# Patient Record
Sex: Male | Born: 1960
Health system: Southern US, Community
[De-identification: ages and names within clinical notes are randomized; demographics above are authoritative.]

## PROBLEM LIST (undated history)

## (undated) DIAGNOSIS — D126 Benign neoplasm of colon, unspecified: Secondary | ICD-10-CM

## (undated) DIAGNOSIS — Z803 Family history of malignant neoplasm of breast: Secondary | ICD-10-CM

## (undated) DIAGNOSIS — Z86010 Personal history of colonic polyps: Secondary | ICD-10-CM

## (undated) DIAGNOSIS — Z1589 Genetic susceptibility to other disease: Secondary | ICD-10-CM

## (undated) DIAGNOSIS — T7840XA Allergy, unspecified, initial encounter: Secondary | ICD-10-CM

## (undated) DIAGNOSIS — Z8481 Family history of carrier of genetic disease: Secondary | ICD-10-CM

## (undated) HISTORY — DX: Personal history of colonic polyps: Z86.010

## (undated) HISTORY — DX: Family history of malignant neoplasm of breast: Z80.3

## (undated) HISTORY — PX: COLONOSCOPY W/ BIOPSIES: SHX1374

## (undated) HISTORY — PX: COLONOSCOPY: SHX174

## (undated) HISTORY — DX: Benign neoplasm of colon, unspecified: D12.6

## (undated) HISTORY — DX: Family history of carrier of genetic disease: Z84.81

## (undated) HISTORY — DX: Allergy, unspecified, initial encounter: T78.40XA

## (undated) HISTORY — DX: Genetic susceptibility to other disease: Z15.89

## (undated) HISTORY — PX: TONSILLECTOMY: SUR1361

## (undated) HISTORY — PX: MOUTH SURGERY: SHX715

---

## 1987-10-26 HISTORY — PX: HERNIA REPAIR: SHX51

## 2011-02-22 DIAGNOSIS — J302 Other seasonal allergic rhinitis: Secondary | ICD-10-CM | POA: Insufficient documentation

## 2014-01-29 DIAGNOSIS — D126 Benign neoplasm of colon, unspecified: Secondary | ICD-10-CM

## 2014-01-29 HISTORY — DX: Benign neoplasm of colon, unspecified: D12.6

## 2014-01-30 DIAGNOSIS — Z860100 Personal history of colon polyps, unspecified: Secondary | ICD-10-CM

## 2014-01-30 HISTORY — DX: Personal history of colon polyps, unspecified: Z86.0100

## 2014-01-30 HISTORY — DX: Personal history of colonic polyps: Z86.010

## 2015-11-03 DIAGNOSIS — F4323 Adjustment disorder with mixed anxiety and depressed mood: Secondary | ICD-10-CM | POA: Diagnosis not present

## 2015-11-10 DIAGNOSIS — J0100 Acute maxillary sinusitis, unspecified: Secondary | ICD-10-CM | POA: Diagnosis not present

## 2015-11-20 DIAGNOSIS — F4323 Adjustment disorder with mixed anxiety and depressed mood: Secondary | ICD-10-CM | POA: Diagnosis not present

## 2015-11-21 DIAGNOSIS — F4323 Adjustment disorder with mixed anxiety and depressed mood: Secondary | ICD-10-CM | POA: Diagnosis not present

## 2015-12-01 DIAGNOSIS — F4323 Adjustment disorder with mixed anxiety and depressed mood: Secondary | ICD-10-CM | POA: Diagnosis not present

## 2015-12-31 DIAGNOSIS — F4323 Adjustment disorder with mixed anxiety and depressed mood: Secondary | ICD-10-CM | POA: Diagnosis not present

## 2016-01-08 DIAGNOSIS — J302 Other seasonal allergic rhinitis: Secondary | ICD-10-CM | POA: Diagnosis not present

## 2016-01-08 DIAGNOSIS — Z0000 Encounter for general adult medical examination without abnormal findings: Secondary | ICD-10-CM | POA: Diagnosis not present

## 2016-01-08 DIAGNOSIS — R351 Nocturia: Secondary | ICD-10-CM | POA: Diagnosis not present

## 2016-01-19 DIAGNOSIS — F4323 Adjustment disorder with mixed anxiety and depressed mood: Secondary | ICD-10-CM | POA: Diagnosis not present

## 2016-01-29 DIAGNOSIS — F4323 Adjustment disorder with mixed anxiety and depressed mood: Secondary | ICD-10-CM | POA: Diagnosis not present

## 2016-02-02 ENCOUNTER — Ambulatory Visit (HOSPITAL_BASED_OUTPATIENT_CLINIC_OR_DEPARTMENT_OTHER): Payer: 59 | Admitting: Genetic Counselor

## 2016-02-02 ENCOUNTER — Other Ambulatory Visit: Payer: Self-pay

## 2016-02-02 ENCOUNTER — Encounter: Payer: Self-pay | Admitting: Genetic Counselor

## 2016-02-02 DIAGNOSIS — Z8481 Family history of carrier of genetic disease: Secondary | ICD-10-CM

## 2016-02-02 DIAGNOSIS — Z315 Encounter for genetic counseling: Secondary | ICD-10-CM

## 2016-02-02 DIAGNOSIS — Z803 Family history of malignant neoplasm of breast: Secondary | ICD-10-CM | POA: Diagnosis not present

## 2016-02-02 NOTE — Progress Notes (Signed)
REFERRING PROVIDER: Saddie Benders, MD  PRIMARY PROVIDER:  No primary care provider on file.  PRIMARY REASON FOR VISIT:  1. Family history of breast cancer   2. Family history of BRCA2 gene positive      HISTORY OF PRESENT ILLNESS:   Mark Osborn, a 55 y.o. male, was seen for a [facility] cancer genetics consultation at the request of Dr. Edsel Petrin due to a family history of cancer.  Mr. Chilson presents to clinic today to discuss the possibility of a hereditary predisposition to cancer, genetic testing, and to further clarify his future cancer risks, as well as potential cancer risks for family members. Mr. Pistilli is a 55 y.o. male with no personal history of cancer.  His sister, father and paternal cousin have all tested positive for an Ashkenazi Jewish founder mutation within [employer].  CANCER HISTORY:   No history exists.     RISK FACTORS:  Colonoscopy: yes; normal. Prostate Cancer screening: No Smoker: no ETOH use: Socially  Past Medical History  Diagnosis Date  . Family history of breast cancer   . Family history of BRCA2 gene positive     History reviewed. No pertinent past surgical history.  Social History   Social History  . Marital Status: Married    Spouse Name: N/A  . Number of Children: 1  . Years of Education: N/A   Social History Main Topics  . Smoking status: Never Smoker   . Smokeless tobacco: None  . Alcohol Use: None     Comment: socially  . Drug Use: None  . Sexual Activity: Not Asked   Other Topics Concern  . None   Social History Narrative  . None     FAMILY HISTORY:  We obtained a detailed, 4-generation family history.  Significant diagnoses are listed below: Family History  Problem Relation Age of Onset  . Breast cancer Mother 57  . BRCA 1/2 Father     BRCA2+  . BRCA 1/2 Sister     BRCA2+  . Breast cancer Paternal Aunt     dx in her 32s  . Diabetes Maternal Grandmother   . Heart disease Maternal Grandmother   . Parkinson's disease  Maternal Grandfather   . Breast cancer Paternal Grandmother     dx in her 32s  . Stomach cancer Paternal Grandfather   . BRCA 1/2 Sister     BRCA2 neg  . Esophageal cancer Maternal Uncle     dx in his 24s; smoker  . Breast cancer Cousin 82  . BRCA 1/2 Cousin     BRCA2+    The patient has a 32 YO daughter who is healthy and cancer free.  He has two sisters, one who is BRCA2+ and the other who is BRCA2-.  Both are cancer free.  His mother was recently diagnosed with breast cancer at 83.  She had two brotehrs, one who died from esophageal cancer.  Her parents were cancer free, but a maternal aunt had breast cancer in her 57s.  The patient's father tested positive for a BRCA2 mutation. He has one sister who had breast cancer in her 79s.  She had one son and one daughter, the daughter had breast cancer at 45 and is BRCA2 positive.  The patient's paternal grandmother had breast cancer in her 45s and his grandfather had stomach cancer.  Patient's maternal ancestors are of [place] European descent, and paternal ancestors are of [place] European descent. There is reported Ashkenazi Jewish ancestry. There is no known  consanguinity.  GENETIC COUNSELING ASSESSMENT: Mark Osborn is a 55 y.o. 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 We, therefore, discussed and recommended the following at today's visit.   DISCUSSION: We discussed that about 5-10% of breast cancer is due to hereditary factors, most commonly BRCA mutations.  Approximately 1 in 36 individuals who are Ashkenazi Jewish have a BRCA mutation, and about 90-95% are due to one of the three founder mutations.  The patient is at 50% risk due to the mutation in his family.  He states that his mother had some testing and was found to be at moderate risk for  colon cancer and that he needed to be tested for mutations within APC.  We discussed familial adenomatous polyposis, and the risks for cancer due to this gene.  Based on the risk for colon cancer due to APC mutations, this is not considered a moderate risk.  We also reviewed other colon cancer syndromes, and discussed genetic testing for these conditions.  Based on his lack of family history for colon cancer, it is unlikely that insurance would cover genetic testing for these genes without a genetic test indicating a risk.  He will look further into this.    We reviewed the characteristics, features and inheritance patterns of hereditary cancer syndromes. We also discussed genetic testing, including the appropriate family members to test, the process of testing, insurance coverage and turn-around-time for results. We also discussed genetic testing through [facility] where we could reflex/resubmit testing for a lower cost in order to get some of the colon cancer genes.  We could also test through [facility] which has a flat out of pocket cost of $50, and would perform testing on 30 genes.  We discussed the implications of a negative, positive and/or variant of uncertain significant result. Mr. Bramhall will call and schedule additional testing, once he learns more about his mother's test result suggesting further testing for APC.  Based on Mr. Weida family history of cancer, he meets medical criteria for genetic testing. Despite that he meets criteria, he may still have an out of pocket cost. We discussed that if his out of pocket cost for testing is over $100, the laboratory will call and confirm whether he wants to proceed with testing.  If the out of pocket cost of testing is less than $100 he will be billed by the genetic testing laboratory.   PLAN: Mr. Lothrop will obtain more information about his mother's testing before pursuing genetic testing himself.  He will call to r/s his blood draw.     Lastly, we encouraged Mr. Poole to remain in contact with cancer genetics annually so that we can continuously update the family history and inform him of any changes in cancer genetics and testing that may be of benefit for this family.   Mr.  Deshler questions were answered to his satisfaction today. Our contact information was provided should additional questions or concerns arise. Thank you for the referral and allowing [place] to share in the care of your patient.   Karen P. Florene Glen, [place], [facility] Certified Genetic Counselor [email or web address] phone: [phone]  The patient was seen for a total of 45 minutes in face-to-face genetic counseling.  This patient was discussed with Drs. Magrinat, Lindi Adie and/or Burr Medico who agrees with the above.  _______________________________________________________________________ For Office Staff:  Number of people involved in session: 1 Was an Intern/ student involved with case: no

## 2016-02-11 DIAGNOSIS — F4323 Adjustment disorder with mixed anxiety and depressed mood: Secondary | ICD-10-CM | POA: Diagnosis not present

## 2016-02-18 ENCOUNTER — Ambulatory Visit: Payer: 59 | Admitting: Genetic Counselor

## 2016-02-18 ENCOUNTER — Encounter: Payer: Self-pay | Admitting: Genetic Counselor

## 2016-02-18 ENCOUNTER — Other Ambulatory Visit: Payer: 59

## 2016-02-18 DIAGNOSIS — Z8481 Family history of carrier of genetic disease: Secondary | ICD-10-CM | POA: Diagnosis not present

## 2016-02-18 DIAGNOSIS — Z803 Family history of malignant neoplasm of breast: Secondary | ICD-10-CM | POA: Diagnosis not present

## 2016-02-18 NOTE — Progress Notes (Signed)
REFERRING PROVIDER: No referring provider defined for this encounter.  PRIMARY PROVIDER:  No primary care provider on file.  PRIMARY REASON FOR VISIT:  1. Family history of breast cancer   2. Family history of BRCA2 gene positive      HISTORY OF PRESENT ILLNESS:   Mark Osborn, a 55 y.o. male, was seen for a [facility] cancer genetics consultation at the request of Dr. No ref. provider found due to a family history of cancer.  Mark Osborn presents to clinic today to discuss the possibility of a hereditary predisposition to cancer, genetic testing, and to further clarify his future cancer risks, as well as potential cancer risks for family members. Please see our previous clinic note, dated February 02, 2016, for our previous discussion about the family history of BRCA2 mutation.  In the meantime, his mother was identified with an APC I1307K mutation.  Mark Osborn returns to clinic to further discuss this finding and genetic testing.  CANCER HISTORY:   No history exists.     RISK FACTORS:  Colonoscopy: yes; normal. Prostate Cancer screening: No Smoker: no ETOH use: Socially  Past Medical History  Diagnosis Date  . Family history of breast cancer   . Family history of BRCA2 gene positive     History reviewed. No pertinent past surgical history.  Social History   Social History  . Marital Status: Married    Spouse Name: N/A  . Number of Children: 1  . Years of Education: N/A   Social History Main Topics  . Smoking status: Never Smoker   . Smokeless tobacco: None  . Alcohol Use: None     Comment: socially  . Drug Use: None  . Sexual Activity: Not Asked   Other Topics Concern  . None   Social History Narrative     FAMILY HISTORY:  We obtained a detailed, 4-generation family history.  Significant diagnoses are listed below: Family History  Problem Relation Age of Onset  . Breast cancer Mother 47  . BRCA 1/2 Father     BRCA2+  . BRCA 1/2 Sister     BRCA2+  .  Breast cancer Paternal Aunt     dx in her 35s  . Diabetes Maternal Grandmother   . Heart disease Maternal Grandmother   . Parkinson's disease Maternal Grandfather   . Breast cancer Paternal Grandmother     dx in her 14s  . Stomach cancer Paternal Grandfather   . BRCA 1/2 Sister     BRCA2 neg  . Esophageal cancer Maternal Uncle     dx in his 50s; smoker  . Breast cancer Cousin 71  . BRCA 1/2 Cousin     BRCA2+    The patient has a 29 YO daughter who is healthy and cancer free. He has two sisters, one who is BRCA2+ and the other who is BRCA2-. Both are cancer free. His mother was recently diagnosed with breast cancer at 41. She had two brotehrs, one who died from esophageal cancer. Her parents were cancer free, but a maternal aunt had breast cancer in her 76s. The patient's father tested positive for a BRCA2 mutation. He has one sister who had breast cancer in her 56s. She had one son and one daughter, the daughter had breast cancer at 50 and is BRCA2 positive. The patient's paternal grandmother had breast cancer in her 28s and his grandfather had stomach cancer. The patient's mother was diagnosed with breast cancer at age 6.  She has undergone  genetic testing and was found to have an APC I1307K mutation.  His mother had two brothers, one who had esophageal cancer.  Her parents died in their 11s.  Her mother's sister also had breast cancer.  Patient's maternal ancestors are of [place] European descent, and paternal ancestors are of [place] European descent. There is reported Ashkenazi Jewish ancestry. There is no known consanguinity.  GENETIC COUNSELING ASSESSMENT: Mark Osborn is a 55 y.o. male with a family history of breast cancer and KFMs in BRCA2 and APC which is somewhat suggestive of a hereditary breast and ovarian cancer syndrome and a moderate risk for colon cancer based and predisposition to cancer. We, therefore, discussed and recommended the following at today's visit.    DISCUSSION: Our previous discussion focused on the family history of breast cancer and the KFM in BRCA2.  Mark Osborn brought in his mother's genetic test report from [facility] which found an APC I1307K mutation.  We discussed that mutations in the APC gene are associated with familial adenomatous polyposis, or FAP. The typical presentation of this condition is associated with 100s-1000s of polyps and a significantly increased risk for colon cancer (up to 100%).  There is an attenuated form where the risk for colon cancer is not quite so elevated, but is still significantly increased.  Lastly, there is the I1307K mutation which has a moderately increased risk for colon cancer.  Approximately 6-7% of individuals with Ashkenazi Jewish ancestry have this particular mutation.  In this population, it is associated with an approximately 2 fold increased risk for colon cancer.  Per NCCN guidelines, individuals with this mutation who do not have a first degree relative with colon cancer should undergo colonoscopy every 5 years starting at age 67.    We reviewed the characteristics, features and inheritance patterns of hereditary cancer syndromes. We also discussed genetic testing, including the appropriate family members to test, the process of testing, insurance coverage and turn-around-time for results. We discussed the implications of a negative, positive and/or variant of uncertain significant result. We recommended Mark Osborn pursue genetic testing for the CancerNext gene panel. We chose the CancerNext panel since this is the panel testing that his mother had as well.  The CancerNext gene panel offered by [facility] includes sequencing and rearrangement analysis for the following 32 genes:   APC, ATM, BARD1, BMPR1A, BRCA1, BRCA2, BRIP1, CDH1, CDK4, CDKN2A, CHEK2, EPCAM, GREM1, MLH1, MRE11A, MSH2, MSH6, MUTYH, NBN, NF1, PALB2, PMS2, POLD1, POLE, PTEN, RAD50, RAD51D, SMAD4, SMARCA4, STK11, and TP53.     Based on Mark Osborn family history of cancer, he meets medical criteria for genetic testing. Despite that he meets criteria, he may still have an out of pocket cost. We discussed that if his out of pocket cost for testing is over $100, the laboratory will call and confirm whether he wants to proceed with testing.  If the out of pocket cost of testing is less than $100 he will be billed by the genetic testing laboratory.   PLAN: After considering the risks, benefits, and limitations, Mark Osborn  provided informed consent to pursue genetic testing and the blood sample was sent to [facility] for analysis of the CancerNext panel. Results should be available within approximately 2-3 weeks' time, at which point they will be disclosed by telephone to Mark Osborn, as will any additional recommendations warranted by these results. Mark Osborn will receive a summary of his genetic counseling visit and a copy of his results once available.  This information will also be available in Epic. We encouraged Mark Osborn to remain in contact with cancer genetics annually so that we can continuously update the family history and inform him of any changes in cancer genetics and testing that may be of benefit for his family. Mark Osborn questions were answered to his satisfaction today. Our contact information was provided should additional questions or concerns arise.  Lastly, we encouraged Mark Osborn to remain in contact with cancer genetics annually so that we can continuously update the family history and inform him of any changes in cancer genetics and testing that may be of benefit for this family.   Mr.  Osborn questions were answered to his satisfaction today. Our contact information was provided should additional questions or concerns arise. Thank you for the referral and allowing [place] to share in the care of your patient.   Patriece Archbold P. Florene Glen, [place], [facility] Certified Genetic  Counselor [email or web address] phone: [phone]  The patient was seen for a total of 20 minutes in face-to-face genetic counseling.  This patient was discussed with Drs. Magrinat, Lindi Adie and/or Burr Medico who agrees with the above.    _______________________________________________________________________ For Office Staff:  Number of people involved in session: 1 Was an Intern/ student involved with case: no

## 2016-02-23 DIAGNOSIS — F4323 Adjustment disorder with mixed anxiety and depressed mood: Secondary | ICD-10-CM | POA: Diagnosis not present

## 2016-03-04 DIAGNOSIS — F4323 Adjustment disorder with mixed anxiety and depressed mood: Secondary | ICD-10-CM | POA: Diagnosis not present

## 2016-03-05 ENCOUNTER — Telehealth: Payer: Self-pay | Admitting: Genetic Counselor

## 2016-03-05 NOTE — Telephone Encounter (Signed)
LM on VM that results are back and to please call me back. 

## 2016-03-10 ENCOUNTER — Encounter: Payer: Self-pay | Admitting: Genetic Counselor

## 2016-03-10 ENCOUNTER — Ambulatory Visit: Payer: 59 | Admitting: Genetic Counselor

## 2016-03-10 DIAGNOSIS — Z803 Family history of malignant neoplasm of breast: Secondary | ICD-10-CM

## 2016-03-10 DIAGNOSIS — Z8481 Family history of carrier of genetic disease: Secondary | ICD-10-CM

## 2016-03-10 DIAGNOSIS — D126 Benign neoplasm of colon, unspecified: Secondary | ICD-10-CM

## 2016-03-10 DIAGNOSIS — Z1589 Genetic susceptibility to other disease: Secondary | ICD-10-CM

## 2016-03-10 DIAGNOSIS — Z1379 Encounter for other screening for genetic and chromosomal anomalies: Secondary | ICD-10-CM | POA: Insufficient documentation

## 2016-03-10 HISTORY — DX: Genetic susceptibility to other disease: Z15.89

## 2016-03-10 NOTE — Progress Notes (Signed)
GENETIC TEST RESULTS   Patient Name: Mark Osborn Patient Age: 55 y.o. Encounter Date: 03/10/2016  Referring Provider: Carlyle Lipa, MD    Mark Osborn was seen in the [facility] clinic on February 02, 2016 and February 18, 2016 due to a family history of cancer and known family mutations in both BRCA2 and APC and concern regarding a hereditary predisposition to cancer in the family. Please refer to the prior Genetics clinic note for more information regarding Mark Osborn medical and family histories and our assessment at the time.   FAMILY HISTORY:  We obtained a detailed, 4-generation family history.  Significant diagnoses are listed below: Family History  Problem Relation Age of Onset  . Breast cancer Mother 61  . BRCA 1/2 Father     BRCA2+  . BRCA 1/2 Sister     BRCA2+  . Breast cancer Paternal Aunt     dx in her 33s  . Diabetes Maternal Grandmother   . Heart disease Maternal Grandmother   . Parkinson's disease Maternal Grandfather   . Breast cancer Paternal Grandmother     dx in her 93s  . Stomach cancer Paternal Grandfather   . BRCA 1/2 Sister     BRCA2 neg  . Esophageal cancer Maternal Uncle     dx in his 41s; smoker  . Breast cancer Cousin 69  . BRCA 1/2 Cousin     BRCA2+    The patient has a 46 YO daughter who is healthy and cancer free. He has two sisters, one who is BRCA2+ and the other who is BRCA2-. Both are cancer free. His mother was recently diagnosed with breast cancer at 21. She had two brotehrs, one who died from esophageal cancer. Her parents were cancer free, but a maternal aunt had breast cancer in her 61s. The patient's father tested positive for a BRCA2 mutation. He has one sister who had breast cancer in her 29s. She had one son and one daughter, the daughter had breast cancer at 6 and is BRCA2 positive. The patient's paternal grandmother had breast cancer in her 49s and his grandfather had stomach cancer. The patient's mother was diagnosed  with breast cancer at age 61. She has undergone genetic testing and was found to have an APC I1307K mutation. His mother had two brothers, one who had esophageal cancer. Her parents died in their 44s. Her mother's sister also had breast cancer. Patient's maternal ancestors are of [place] European descent, and paternal ancestors are of [place] European descent. There is reported Ashkenazi Jewish ancestry. There is no known consanguinity.  GENETIC TESTING:  At the time of Mark Osborn visit, we recommended he pursue genetic testing of the CancerNext panel based on two known familial mutations, one in BRCA2 (one of the Ashkenazi Jewish mutations) and another in APC gene. The CancerNext gene panel offered by [facility] includes sequencing and rearrangement analysis for the following 32 genes:   APC, ATM, BARD1, BMPR1A, BRCA1, BRCA2, BRIP1, CDH1, CDK4, CDKN2A, CHEK2, EPCAM, GREM1, MLH1, MRE11A, MSH2, MSH6, MUTYH, NBN, NF1, PALB2, PMS2, POLD1, POLE, PTEN, RAD50, RAD51D, SMAD4, SMARCA4, STK11, and TP53.   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  We discussed that pathogenic variants with the APC tumor suppressor gene conveys a high risk for colorectal cancer, and can increase the risk for other cancers including pancreatic, thyroid, gastric, duodenal and periampullary cancer.  Typically, pathogenic variants in this gene convey up to a 100% lifetime risk for colon cancer.  However, certain pathogenic variants within this gene have been associated with lower risks of cancer.  The I1307K variant found in Mark Osborn is considered a moderate risk pathogenic variant, with an estimated 2 fold increased risk for colon cancer Lisabeth Devoid et al, 2013;Rennert et al, 2005; Waynetta Pean et al, 1999).  The I1307K variant is considered to be a founder mutation within the [place] population.  It is seen in approximately 6-7% of individuals in the [place] population, and up to 2% in the  non-Ashkenazi Jewish population (Rennert et al 2005).  Most studies have looked at this mutation in the Jewish population; therefore the cancer risks are inconsistent outside of this population.  Based on several studies of individuals with the I1307K variant, there is an increased number of adenomatous polyps and colon cancer, most typically at younger ages of onset.  Therefore, increased screening is recommended for all carriers.  This should include the following (Boursi et al, 2013):  1. Screening colonoscopies starting at age 52 2. Colonoscopy every 5 years, or sooner based on family history of polyp count.  Based on current literature, there are no specific screening recommendations for other cancers associated with APC mutations for individuals with the I1307K variant.  FAMILY MEMBERS: It is important that all of Mark Osborn relatives (both men and women) know of the presence of this gene mutation. Site-specific genetic testing can sort out who in the family is at risk and who is not.   Mark Osborn daughter has a 50% chance to have inherited this mutation. However, she is relatively young and this will not be of any consequence to them for several years. We do not test children because there is no risk to them until they are adults. We recommend that Mark Osborn daughter undergo genetic counseling and testing by the time she is in her 77s-30s.    Mark Osborn siblings have a 50% chance to have inherited this mutation. We recommend they have genetic testing for this same mutation, as identifying the presence of this mutation would allow them to also take advantage of risk-reducing measures. Both of his sisters have undergone genetic testing for the known familial mutation in BRCA2.  One tested positive and one negative.  Both have a 50% chance of having this APC mutation and therefore, one sister could have a mutation in both APC and BRCA2.  SUPPORT AND RESOURCES: Mark Osborn was provided  information to the [facility] out of [employer].  The benefits and limitations to this registry were discussed.  Additionally, we recommended that, if Mark Osborn did not have a GI provider who performs his colonoscopies, we would refer him to the [facility] as they follow many individuals with hereditary colon cancer syndromes.  Mark Osborn stated he would reach out to them to schedule an appointment.  His last colonoscopy was within the last 3 years.  I offered to provide information about support groups for families with APC mutations, with the caveat that the majority of cases will have the typical high risk gene mutations.  Mark Osborn declined this offer.  His sisters need to undergo genetic testing for this mutation.  To locate genetic counselors in other cities, visit the website of the [employer] (ArtistMovie.se) and Secretary/administrator for a Social worker by zip code.  We encouraged Mark Osborn to remain in contact with [place] on an annual basis so we can update his personal and family histories, and let him know of  advances in cancer genetics that may benefit the family. Our contact number was provided. Mark Osborn questions were answered to his satisfaction today, and he knows he is welcome to call anytime with additional questions.   Danelia Snodgrass P. Florene Glen, [place], [facility] Certified Genetic Counselor [email or web address] phone: [phone]

## 2016-03-11 ENCOUNTER — Telehealth: Payer: Self-pay

## 2016-03-11 MED FILL — ZOLPIDEM TARTRATE 10 MG TAB: 10 | 30 days supply | Qty: 30 | Fill #1

## 2016-03-11 NOTE — Telephone Encounter (Signed)
Santiago Glad,      Thanks. We'll reach out to him from our end as well.        Caitlyne Ingham,    Can you call him to help with NGI appt, any provider, next available for genetic abnormality; APC mutation. 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 He does not have the typical FAP risk associated with this mutation. He will call the [facility] to set up an appointment, and I am sending this to you all as a heads up. I don't know who he will be scheduled with.

## 2016-03-11 NOTE — Telephone Encounter (Signed)
Pt has been scheduled to see Dr Carlean Purl

## 2016-03-25 DIAGNOSIS — F4323 Adjustment disorder with mixed anxiety and depressed mood: Secondary | ICD-10-CM | POA: Diagnosis not present

## 2016-04-09 DIAGNOSIS — F4323 Adjustment disorder with mixed anxiety and depressed mood: Secondary | ICD-10-CM | POA: Diagnosis not present

## 2016-04-14 DIAGNOSIS — F4323 Adjustment disorder with mixed anxiety and depressed mood: Secondary | ICD-10-CM | POA: Diagnosis not present

## 2016-04-26 DIAGNOSIS — F5101 Primary insomnia: Secondary | ICD-10-CM | POA: Diagnosis not present

## 2016-04-29 DIAGNOSIS — F4323 Adjustment disorder with mixed anxiety and depressed mood: Secondary | ICD-10-CM | POA: Diagnosis not present

## 2016-04-30 MED FILL — ZOLPIDEM TARTRATE 10 MG TAB: 10 | 30 days supply | Qty: 30 | Fill #0

## 2016-05-11 ENCOUNTER — Encounter: Payer: Self-pay | Admitting: Internal Medicine

## 2016-05-11 ENCOUNTER — Ambulatory Visit (INDEPENDENT_AMBULATORY_CARE_PROVIDER_SITE_OTHER): Payer: 59 | Admitting: Internal Medicine

## 2016-05-11 VITALS — BP 98/50 | HR 88 | Ht 66.5 in | Wt 139.6 lb

## 2016-05-11 DIAGNOSIS — Z86010 Personal history of colonic polyps: Secondary | ICD-10-CM

## 2016-05-11 DIAGNOSIS — Z1589 Genetic susceptibility to other disease: Secondary | ICD-10-CM | POA: Diagnosis not present

## 2016-05-11 NOTE — Progress Notes (Signed)
      Subjective:    Patient ID: OSCER LESSARD, male    DOB: 1961-07-17, 55 y.o.   MRN: [id number] Cc: APC mutation HPI  Very nice man w/ recent detection of APC mutation  Also w/ hx adenomatous colonic polyps removed 2015 - April 8 3 adenomas max 10 mm ([facility])  Medications, allergies, past medical history, past surgical history, family history and social history are reviewed and updated in the EMR.  Review of Systems Otherwise negative    Objective:   Physical Exam BP 98/50 mmHg  Pulse 88  Ht 5' 6.5" (1.689 m)  Wt 139 lb 9.6 oz (63.322 kg)  BMI 22.20 kg/m2 NAD     Assessment & Plan:   Encounter Diagnoses  Name Primary?  . Personal history of colonic polyps Yes  .  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 Recall 01/2017

## 2016-05-11 NOTE — Assessment & Plan Note (Signed)
noted 

## 2016-05-11 NOTE — Patient Instructions (Signed)
   We are putting you in the system for a colon recall for 01/2017.     I appreciate the opportunity to care for you. Silvano Rusk, MD, [facility]

## 2016-05-13 ENCOUNTER — Encounter: Payer: Self-pay | Admitting: Internal Medicine

## 2016-05-20 DIAGNOSIS — F4323 Adjustment disorder with mixed anxiety and depressed mood: Secondary | ICD-10-CM | POA: Diagnosis not present

## 2016-06-17 DIAGNOSIS — F4323 Adjustment disorder with mixed anxiety and depressed mood: Secondary | ICD-10-CM | POA: Diagnosis not present

## 2016-06-24 DIAGNOSIS — H524 Presbyopia: Secondary | ICD-10-CM | POA: Diagnosis not present

## 2016-06-24 MED FILL — ZOLPIDEM TARTRATE 10 MG TAB: 10 | 30 days supply | Qty: 30 | Fill #1

## 2016-07-09 DIAGNOSIS — F4323 Adjustment disorder with mixed anxiety and depressed mood: Secondary | ICD-10-CM | POA: Diagnosis not present

## 2016-07-23 DIAGNOSIS — F4323 Adjustment disorder with mixed anxiety and depressed mood: Secondary | ICD-10-CM | POA: Diagnosis not present

## 2016-08-02 DIAGNOSIS — F4323 Adjustment disorder with mixed anxiety and depressed mood: Secondary | ICD-10-CM | POA: Diagnosis not present

## 2016-08-13 DIAGNOSIS — F4323 Adjustment disorder with mixed anxiety and depressed mood: Secondary | ICD-10-CM | POA: Diagnosis not present

## 2016-08-19 MED FILL — ZOLPIDEM TARTRATE 10 MG TAB: 10 | 30 days supply | Qty: 30 | Fill #2

## 2016-09-03 DIAGNOSIS — F4323 Adjustment disorder with mixed anxiety and depressed mood: Secondary | ICD-10-CM | POA: Diagnosis not present

## 2016-09-15 DIAGNOSIS — F4323 Adjustment disorder with mixed anxiety and depressed mood: Secondary | ICD-10-CM | POA: Diagnosis not present

## 2016-09-17 DIAGNOSIS — F4323 Adjustment disorder with mixed anxiety and depressed mood: Secondary | ICD-10-CM | POA: Diagnosis not present

## 2016-09-27 DIAGNOSIS — F4323 Adjustment disorder with mixed anxiety and depressed mood: Secondary | ICD-10-CM | POA: Diagnosis not present

## 2016-10-12 MED FILL — ZOLPIDEM TARTRATE 10 MG TAB: 10 | 30 days supply | Qty: 30 | Fill #3

## 2016-10-28 DIAGNOSIS — F4323 Adjustment disorder with mixed anxiety and depressed mood: Secondary | ICD-10-CM | POA: Diagnosis not present

## 2016-10-29 DIAGNOSIS — F4323 Adjustment disorder with mixed anxiety and depressed mood: Secondary | ICD-10-CM | POA: Diagnosis not present

## 2016-11-04 DIAGNOSIS — F4323 Adjustment disorder with mixed anxiety and depressed mood: Secondary | ICD-10-CM | POA: Diagnosis not present

## 2016-11-12 DIAGNOSIS — B9789 Other viral agents as the cause of diseases classified elsewhere: Secondary | ICD-10-CM | POA: Diagnosis not present

## 2016-11-12 DIAGNOSIS — J069 Acute upper respiratory infection, unspecified: Secondary | ICD-10-CM | POA: Diagnosis not present

## 2016-11-12 DIAGNOSIS — R6889 Other general symptoms and signs: Secondary | ICD-10-CM | POA: Diagnosis not present

## 2016-11-18 DIAGNOSIS — F4323 Adjustment disorder with mixed anxiety and depressed mood: Secondary | ICD-10-CM | POA: Diagnosis not present

## 2016-11-20 DIAGNOSIS — F4323 Adjustment disorder with mixed anxiety and depressed mood: Secondary | ICD-10-CM | POA: Diagnosis not present

## 2016-11-25 DIAGNOSIS — F4323 Adjustment disorder with mixed anxiety and depressed mood: Secondary | ICD-10-CM | POA: Diagnosis not present

## 2016-11-29 ENCOUNTER — Encounter: Payer: Self-pay | Admitting: Internal Medicine

## 2016-12-08 ENCOUNTER — Encounter (HOSPITAL_COMMUNITY): Payer: Self-pay

## 2016-12-09 DIAGNOSIS — F4323 Adjustment disorder with mixed anxiety and depressed mood: Secondary | ICD-10-CM | POA: Diagnosis not present

## 2016-12-16 DIAGNOSIS — F4323 Adjustment disorder with mixed anxiety and depressed mood: Secondary | ICD-10-CM | POA: Diagnosis not present

## 2016-12-23 DIAGNOSIS — F4323 Adjustment disorder with mixed anxiety and depressed mood: Secondary | ICD-10-CM | POA: Diagnosis not present

## 2016-12-27 DIAGNOSIS — F4323 Adjustment disorder with mixed anxiety and depressed mood: Secondary | ICD-10-CM | POA: Diagnosis not present

## 2017-01-06 DIAGNOSIS — F4323 Adjustment disorder with mixed anxiety and depressed mood: Secondary | ICD-10-CM | POA: Diagnosis not present

## 2017-01-13 DIAGNOSIS — F4323 Adjustment disorder with mixed anxiety and depressed mood: Secondary | ICD-10-CM | POA: Diagnosis not present

## 2017-01-17 ENCOUNTER — Ambulatory Visit (AMBULATORY_SURGERY_CENTER): Payer: Self-pay | Admitting: *Deleted

## 2017-01-17 VITALS — Ht 66.5 in | Wt 134.0 lb

## 2017-01-17 DIAGNOSIS — Z86010 Personal history of colonic polyps: Secondary | ICD-10-CM

## 2017-01-17 NOTE — Progress Notes (Signed)
Patient denies any allergies to eggs or soy. Patient denies any problems with anesthesia/sedation. Patient denies any oxygen use at home and does not take any diet/weight loss medications. EMMI education declined by pt.

## 2017-01-18 ENCOUNTER — Encounter: Payer: Self-pay | Admitting: Internal Medicine

## 2017-01-20 DIAGNOSIS — F4323 Adjustment disorder with mixed anxiety and depressed mood: Secondary | ICD-10-CM | POA: Diagnosis not present

## 2017-01-27 DIAGNOSIS — Z86010 Personal history of colonic polyps: Secondary | ICD-10-CM | POA: Diagnosis not present

## 2017-01-27 DIAGNOSIS — J302 Other seasonal allergic rhinitis: Secondary | ICD-10-CM | POA: Diagnosis not present

## 2017-01-27 DIAGNOSIS — Z0000 Encounter for general adult medical examination without abnormal findings: Secondary | ICD-10-CM | POA: Diagnosis not present

## 2017-01-27 DIAGNOSIS — F4323 Adjustment disorder with mixed anxiety and depressed mood: Secondary | ICD-10-CM | POA: Diagnosis not present

## 2017-01-28 DIAGNOSIS — F4323 Adjustment disorder with mixed anxiety and depressed mood: Secondary | ICD-10-CM | POA: Diagnosis not present

## 2017-01-31 ENCOUNTER — Ambulatory Visit (AMBULATORY_SURGERY_CENTER): Payer: 59 | Admitting: Internal Medicine

## 2017-01-31 ENCOUNTER — Encounter: Payer: Self-pay | Admitting: Internal Medicine

## 2017-01-31 VITALS — BP 97/62 | HR 77 | Temp 98.0°F | Resp 18 | Ht 64.0 in | Wt 136.0 lb

## 2017-01-31 DIAGNOSIS — D124 Benign neoplasm of descending colon: Secondary | ICD-10-CM | POA: Diagnosis not present

## 2017-01-31 DIAGNOSIS — D125 Benign neoplasm of sigmoid colon: Secondary | ICD-10-CM

## 2017-01-31 DIAGNOSIS — Z86010 Personal history of colonic polyps: Secondary | ICD-10-CM

## 2017-01-31 DIAGNOSIS — D128 Benign neoplasm of rectum: Secondary | ICD-10-CM | POA: Diagnosis not present

## 2017-01-31 MED ORDER — SODIUM CHLORIDE 0.9 % IV SOLN: 500.0000 mL | INTRAVENOUS | Status: DC

## 2017-01-31 NOTE — Patient Instructions (Addendum)
I found and removed 4 small polyps that look benign. I will let you know pathology results and when to have another routine colonoscopy by mail and/or My Chart. I would expect it will be in 3 years.  I appreciate the opportunity to care for you. Gatha Mayer, MD, FACG   YOU HAD AN ENDOSCOPIC PROCEDURE TODAY AT [facility]:   Refer to the procedure report that was given to you for any specific questions about what was found during the examination.  If the procedure report does not answer your questions, please call your gastroenterologist to clarify.  If you requested that your care partner not be given the details of your procedure findings, then the procedure report has been included in a sealed envelope for you to review at your convenience later.  YOU SHOULD EXPECT: Some feelings of bloating in the abdomen. Passage of more gas than usual.  Walking can help get rid of the air that was put into your GI tract during the procedure and reduce the bloating. If you had a lower endoscopy (such as a colonoscopy or flexible sigmoidoscopy) you may notice spotting of blood in your stool or on the toilet paper. If you underwent a bowel prep for your procedure, you may not have a normal bowel movement for a few days.  Please Note:  You might notice some irritation and congestion in your nose or some drainage.  This is from the oxygen used during your procedure.  There is no need for concern and it should clear up in a day or so.  SYMPTOMS TO REPORT IMMEDIATELY:   Following lower endoscopy (colonoscopy or flexible sigmoidoscopy):  Excessive amounts of blood in the stool  Significant tenderness or worsening of abdominal pains  Swelling of the abdomen that is new, acute  Fever of 100F or higher    For urgent or emergent issues, a gastroenterologist can be reached at any hour by calling [phone].   DIET:  We do recommend a small meal at first, but then you may proceed  to your regular diet.  Drink plenty of fluids but you should avoid alcoholic beverages for 24 hours.  ACTIVITY:  You should plan to take it easy for the rest of today and you should NOT DRIVE or use heavy machinery until tomorrow (because of the sedation medicines used during the test).    FOLLOW UP: Our staff will call the number listed on your records the next business day following your procedure to check on you and address any questions or concerns that you may have regarding the information given to you following your procedure. If we do not reach you, we will leave a message.  However, if you are feeling well and you are not experiencing any problems, there is no need to return our call.  We will assume that you have returned to your regular daily activities without incident.  If any biopsies were taken you will be contacted by phone or by letter within the next 1-3 weeks.  Please call us at [phone] if you have not heard about the biopsies in 3 weeks.    SIGNATURES/CONFIDENTIALITY: You and/or your care partner have signed paperwork which will be entered into your electronic medical record.  These signatures attest to the fact that that the information above on your After Visit Summary has been reviewed and is understood.  Full responsibility of the confidentiality of this discharge information lies with you and/or your care-partner.  Information on polyps given to you today

## 2017-01-31 NOTE — Progress Notes (Signed)
Report given to PACU, vss 

## 2017-01-31 NOTE — Progress Notes (Signed)
Pt's states no medical or surgical changes since previsit or office visit. 

## 2017-01-31 NOTE — Op Note (Signed)
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 The patient's tolerance of                            previous anesthesia was also reviewed. The risks                            and benefits of the procedure and the sedation                            options and risks were discussed with the patient.                            All questions were answered, and informed consent                            was obtained. Prior Anticoagulants: The patient has                            taken no previous anticoagulant or antiplatelet                            agents. ASA Grade Assessment: II - A patient with                            mild systemic disease. After reviewing the risks                            and benefits, the patient was deemed in  satisfactory condition to undergo the procedure.                           After obtaining informed consent, the colonoscope                            was passed under direct vision. Throughout the                            procedure, the patient's blood pressure, pulse, and                            oxygen saturations were monitored continuously. The                            Colonoscope was introduced  through the anus and                            advanced to the the cecum, identified by                            appendiceal orifice and ileocecal valve. The                            colonoscopy was performed without difficulty. The                            patient tolerated the procedure well. The quality                            of the bowel preparation was adequate. The                            ileocecal valve, appendiceal orifice, and rectum                            were photographed. The bowel preparation used was                            Miralax. Scope In: 9:30:47 AM Scope Out: 9:57:10 AM Scope Withdrawal Time: 0 hours 19 minutes 16 seconds  Total Procedure Duration: 0 hours 26 minutes 23 seconds  Findings:                 The perianal and digital rectal examinations were                            normal. Pertinent negatives include normal prostate                            (size, shape, and consistency).                           Three sessile polyps were found in the rectum,  sigmoid colon and descending colon. The polyps were                            4 to 6 mm in size. These polyps were removed with a                            cold snare. Resection and retrieval were complete.                            Verification of patient identification for the                            specimen was done. Estimated blood loss was minimal.                           A 2 mm polyp was found in the descending colon. The                            polyp was sessile. The polyp was removed with a                            cold biopsy forceps. Resection and retrieval were                            complete. Verification of patient identification                            for the specimen was done. Estimated blood loss was                            minimal.                           The exam was otherwise without abnormality on                             direct and retroflexion views. Complications:            No immediate complications. Estimated Blood Loss:     Estimated blood loss was minimal. Impression:               - Three 4 to 6 mm polyps in the rectum, in the                            sigmoid colon and in the descending colon, removed                            with a cold snare. Resected and retrieved.                           - One 2 mm polyp in the descending colon, removed  with a cold biopsy forceps. Resected and retrieved.                           - The examination was otherwise normal on direct                            and retroflexion views.                           - Personal history of colonic polyps. 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 The signs and symptoms of potential                            delayed complications were discussed with the                            patient. Return to normal activities tomorrow.                            Written discharge instructions were provided to the                            patient.                           - Resume previous diet.                           - Continue present medications.                           - Repeat colonoscopy in 3 years for surveillance.                           - Better seed and fiber restriction vs other/extra                            prep next time Gatha Mayer, MD 01/31/2017 10:10:24 AM This report has been signed electronically.

## 2017-01-31 NOTE — Progress Notes (Signed)
Called to room to assist during endoscopic procedure.  Patient ID and intended procedure confirmed with present staff. Received instructions for my participation in the procedure from the performing physician.  

## 2017-02-01 ENCOUNTER — Telehealth: Payer: Self-pay | Admitting: *Deleted

## 2017-02-01 NOTE — Telephone Encounter (Signed)
No answer left message and will attempt to call back later this afternoon. SM

## 2017-02-09 ENCOUNTER — Telehealth: Payer: Self-pay | Admitting: Internal Medicine

## 2017-02-09 ENCOUNTER — Encounter: Payer: Self-pay | Admitting: Internal Medicine

## 2017-02-09 NOTE — Progress Notes (Signed)
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

## 2017-02-09 NOTE — Progress Notes (Signed)
3 adenomas one with metaplastic bone Colon recall 3 yrs as planned - 2021 My Chart letter

## 2017-02-10 DIAGNOSIS — F4323 Adjustment disorder with mixed anxiety and depressed mood: Secondary | ICD-10-CM | POA: Diagnosis not present

## 2017-02-10 NOTE — Telephone Encounter (Signed)
Left message for patient to call back  

## 2017-02-14 NOTE — Telephone Encounter (Signed)
All questions about pathology letter answered.  Dr. Carlean Purl sent him a MYChart message as well

## 2017-02-17 DIAGNOSIS — F4323 Adjustment disorder with mixed anxiety and depressed mood: Secondary | ICD-10-CM | POA: Diagnosis not present

## 2017-02-24 DIAGNOSIS — F4323 Adjustment disorder with mixed anxiety and depressed mood: Secondary | ICD-10-CM | POA: Diagnosis not present

## 2017-03-10 DIAGNOSIS — F4323 Adjustment disorder with mixed anxiety and depressed mood: Secondary | ICD-10-CM | POA: Diagnosis not present

## 2017-03-14 DIAGNOSIS — F4323 Adjustment disorder with mixed anxiety and depressed mood: Secondary | ICD-10-CM | POA: Diagnosis not present

## 2017-03-17 DIAGNOSIS — F4323 Adjustment disorder with mixed anxiety and depressed mood: Secondary | ICD-10-CM | POA: Diagnosis not present

## 2017-03-28 DIAGNOSIS — F4323 Adjustment disorder with mixed anxiety and depressed mood: Secondary | ICD-10-CM | POA: Diagnosis not present

## 2017-03-31 DIAGNOSIS — F4323 Adjustment disorder with mixed anxiety and depressed mood: Secondary | ICD-10-CM | POA: Diagnosis not present

## 2017-04-21 DIAGNOSIS — F4323 Adjustment disorder with mixed anxiety and depressed mood: Secondary | ICD-10-CM | POA: Diagnosis not present

## 2017-04-22 DIAGNOSIS — F4323 Adjustment disorder with mixed anxiety and depressed mood: Secondary | ICD-10-CM | POA: Diagnosis not present

## 2017-04-28 DIAGNOSIS — F4323 Adjustment disorder with mixed anxiety and depressed mood: Secondary | ICD-10-CM | POA: Diagnosis not present

## 2017-05-05 DIAGNOSIS — F4323 Adjustment disorder with mixed anxiety and depressed mood: Secondary | ICD-10-CM | POA: Diagnosis not present

## 2017-05-09 DIAGNOSIS — F4323 Adjustment disorder with mixed anxiety and depressed mood: Secondary | ICD-10-CM | POA: Diagnosis not present

## 2017-05-30 DIAGNOSIS — F4323 Adjustment disorder with mixed anxiety and depressed mood: Secondary | ICD-10-CM | POA: Diagnosis not present

## 2017-06-02 DIAGNOSIS — F4323 Adjustment disorder with mixed anxiety and depressed mood: Secondary | ICD-10-CM | POA: Diagnosis not present

## 2017-06-15 DIAGNOSIS — F4323 Adjustment disorder with mixed anxiety and depressed mood: Secondary | ICD-10-CM | POA: Diagnosis not present

## 2017-06-20 DIAGNOSIS — F4323 Adjustment disorder with mixed anxiety and depressed mood: Secondary | ICD-10-CM | POA: Diagnosis not present

## 2017-07-01 DIAGNOSIS — F4323 Adjustment disorder with mixed anxiety and depressed mood: Secondary | ICD-10-CM | POA: Diagnosis not present

## 2017-07-13 DIAGNOSIS — H524 Presbyopia: Secondary | ICD-10-CM | POA: Diagnosis not present

## 2017-07-19 DIAGNOSIS — F4323 Adjustment disorder with mixed anxiety and depressed mood: Secondary | ICD-10-CM | POA: Diagnosis not present

## 2017-08-01 DIAGNOSIS — F4323 Adjustment disorder with mixed anxiety and depressed mood: Secondary | ICD-10-CM | POA: Diagnosis not present

## 2017-08-15 DIAGNOSIS — F4323 Adjustment disorder with mixed anxiety and depressed mood: Secondary | ICD-10-CM | POA: Diagnosis not present

## 2017-08-23 DIAGNOSIS — F4323 Adjustment disorder with mixed anxiety and depressed mood: Secondary | ICD-10-CM | POA: Diagnosis not present

## 2017-09-07 DIAGNOSIS — F4323 Adjustment disorder with mixed anxiety and depressed mood: Secondary | ICD-10-CM | POA: Diagnosis not present

## 2017-09-13 DIAGNOSIS — F4323 Adjustment disorder with mixed anxiety and depressed mood: Secondary | ICD-10-CM | POA: Diagnosis not present

## 2017-09-19 DIAGNOSIS — F4323 Adjustment disorder with mixed anxiety and depressed mood: Secondary | ICD-10-CM | POA: Diagnosis not present

## 2017-09-26 DIAGNOSIS — F4323 Adjustment disorder with mixed anxiety and depressed mood: Secondary | ICD-10-CM | POA: Diagnosis not present

## 2017-10-24 MED FILL — ZOLPIDEM TARTRATE 10 MG TAB: 10 | 30 days supply | Qty: 30 | Fill #0

## 2017-10-31 DIAGNOSIS — F4323 Adjustment disorder with mixed anxiety and depressed mood: Secondary | ICD-10-CM | POA: Diagnosis not present

## 2017-11-14 DIAGNOSIS — F4323 Adjustment disorder with mixed anxiety and depressed mood: Secondary | ICD-10-CM | POA: Diagnosis not present

## 2017-12-02 DIAGNOSIS — F4323 Adjustment disorder with mixed anxiety and depressed mood: Secondary | ICD-10-CM | POA: Diagnosis not present

## 2017-12-23 DIAGNOSIS — F4323 Adjustment disorder with mixed anxiety and depressed mood: Secondary | ICD-10-CM | POA: Diagnosis not present

## 2017-12-26 MED FILL — ZOLPIDEM TARTRATE 10 MG TAB: 10 | 30 days supply | Qty: 30 | Fill #1

## 2017-12-28 DIAGNOSIS — F5101 Primary insomnia: Secondary | ICD-10-CM | POA: Diagnosis not present

## 2017-12-28 MED FILL — BELSOMRA 10 MG TABLET: 10 | 30 days supply | Qty: 30 | Fill #0

## 2018-01-01 DIAGNOSIS — F4323 Adjustment disorder with mixed anxiety and depressed mood: Secondary | ICD-10-CM | POA: Diagnosis not present

## 2018-01-09 DIAGNOSIS — F4323 Adjustment disorder with mixed anxiety and depressed mood: Secondary | ICD-10-CM | POA: Diagnosis not present

## 2018-01-12 ENCOUNTER — Encounter: Payer: Self-pay | Admitting: Nurse Practitioner

## 2018-01-12 ENCOUNTER — Ambulatory Visit: Payer: Self-pay | Admitting: Nurse Practitioner

## 2018-01-12 VITALS — BP 112/60 | HR 93 | Temp 98.3°F | Wt 135.6 lb

## 2018-01-12 DIAGNOSIS — J309 Allergic rhinitis, unspecified: Secondary | ICD-10-CM

## 2018-01-12 MED ORDER — MONTELUKAST SODIUM 10 MG PO TABS: 10.0000 mg | ORAL_TABLET | Freq: Every day | ORAL | 1 refills | Status: DC

## 2018-01-12 MED ORDER — FLUTICASONE PROPIONATE 50 MCG/ACT NA SUSP: 2.0000 | Freq: Every day | NASAL | 0 refills | Status: DC

## 2018-01-12 MED ORDER — PSEUDOEPH-BROMPHEN-DM 30-2-10 MG/5ML PO SYRP: 5.0000 mL | ORAL_SOLUTION | Freq: Four times a day (QID) | ORAL | 0 refills | Status: AC | PRN

## 2018-01-12 MED FILL — MONTELUKAST SOD 10 MG TAB: 10 | 30 days supply | Qty: 30 | Fill #0

## 2018-01-12 MED FILL — BROMIPHENIR-PSEUDOEPHED-DM: 30-2-10 | 7 days supply | Qty: 150 | Fill #0

## 2018-01-12 MED FILL — FLUTICASONE PROP 50 MCG SPR: 50 | 30 days supply | Qty: 16 | Fill #0

## 2018-01-12 NOTE — Patient Instructions (Signed)

## 2018-01-12 NOTE — Progress Notes (Signed)
Subjective:    Patient ID: Mark Osborn, male    DOB: 1960-12-28, 57 y.o.   MRN: [id number]  Cough  This is a new problem. The current episode started in the past 7 days. The problem has been unchanged. The problem occurs every few hours. The cough is non-productive. Associated symptoms include headaches, nasal congestion, postnasal drip, rhinorrhea and a sore throat. Pertinent negatives include no chest pain, chills, fever, shortness of breath or wheezing. Associated symptoms comments: Ear pressure/fullness bilaterally. The symptoms are aggravated by pollens and other (change in weather). Treatments tried: acetaminophen. The treatment provided mild relief. His past medical history is significant for environmental allergies. There is no history of asthma, bronchiectasis or bronchitis.      Review of Systems  Constitutional: Negative for chills and fever.  HENT: Positive for postnasal drip, rhinorrhea and sore throat.        Ear fullness/pressure  Eyes: Negative.   Respiratory: Positive for cough. Negative for shortness of breath and wheezing.   Cardiovascular: Negative for chest pain.  Gastrointestinal: Negative for abdominal pain, nausea and vomiting.       Decreased appetite  Musculoskeletal: Negative.   Skin: Negative.   Allergic/Immunologic: Positive for environmental allergies.  Neurological: Positive for headaches.       Objective:   Physical Exam  Constitutional: He is oriented to person, place, and time. He appears well-developed and well-nourished.  HENT:  Head: Normocephalic and atraumatic.  Right Ear: External ear normal.  Left Ear: External ear normal.  turbinates inflamed, mild maxillary sinus tenderness. Tonsils with mild oropharyngeal swelling, no exudate  Eyes: Pupils are equal, round, and reactive to light. Conjunctivae and EOM are normal.  Neck: Normal range of motion. Neck supple. No tracheal deviation present. No thyromegaly present.  Cardiovascular: Normal  rate, regular rhythm and normal heart sounds.  Pulmonary/Chest: Effort normal and breath sounds normal. No respiratory distress. He has no wheezes.  Abdominal: Soft. Bowel sounds are normal. He exhibits no distension. There is no tenderness.  Neurological: He is alert and oriented to person, place, and time.  Skin: Skin is warm and dry.  Psychiatric: He has a normal mood and affect. His behavior is normal. Judgment and thought content normal.          Assessment & Plan:  Allergic Rhinitis Discussed with patient difference between allergic rhinitis and bacterial sinusitis.  Patient informed that he does not need an antibiotic at this time.  Patient given patient education for allergic rhinitis.  Patient will continue use of Zyrtec, will start Singulair as prescribed.  Patient instructed to use humidifier at home. Patient will follow up if no improvement in his symptoms. Patient will follow up in ER if difficulty breathing, SOB or other concerns.  Patient verbalizes understanding and has no questions at time of discharge. Meds ordered this encounter  Medications  . fluticasone (FLONASE) 50 MCG/ACT nasal spray    Sig: Place 2 sprays into both nostrils daily for 10 days.    Dispense:  16 g    Refill:  0    Order Specific Question:   Supervising Provider    Answer:   Ricard Dillon [[id number]]  . montelukast (SINGULAIR) 10 MG tablet    Sig: Take 1 tablet (10 mg total) by mouth at bedtime.    Dispense:  30 tablet    Refill:  1    Order Specific Question:   Supervising Provider    Answer:   Ricard Dillon [[id number]]  .  brompheniramine-pseudoephedrine-DM 30-2-10 MG/5ML syrup    Sig: Take 5 mLs by mouth 4 (four) times daily as needed for up to 7 days.    Dispense:  150 mL    Refill:  0    Order Specific Question:   Supervising Provider    Answer:   Ricard Dillon [phone]

## 2018-01-16 ENCOUNTER — Telehealth: Payer: Self-pay

## 2018-01-16 NOTE — Telephone Encounter (Signed)
Pt did not answer. Left Pt a voice mail stating if they have any concerns to come in or give [place] a call.

## 2018-01-22 DIAGNOSIS — F4323 Adjustment disorder with mixed anxiety and depressed mood: Secondary | ICD-10-CM | POA: Diagnosis not present

## 2018-01-23 MED FILL — ZOLPIDEM TART ER 12.5 MG TA: 12.5 | 30 days supply | Qty: 30 | Fill #0

## 2018-02-15 DIAGNOSIS — F4323 Adjustment disorder with mixed anxiety and depressed mood: Secondary | ICD-10-CM | POA: Diagnosis not present

## 2018-02-17 DIAGNOSIS — F4323 Adjustment disorder with mixed anxiety and depressed mood: Secondary | ICD-10-CM | POA: Diagnosis not present

## 2018-02-20 DIAGNOSIS — Z1159 Encounter for screening for other viral diseases: Secondary | ICD-10-CM | POA: Diagnosis not present

## 2018-02-20 DIAGNOSIS — Z86010 Personal history of colonic polyps: Secondary | ICD-10-CM | POA: Diagnosis not present

## 2018-02-20 DIAGNOSIS — Z0000 Encounter for general adult medical examination without abnormal findings: Secondary | ICD-10-CM | POA: Diagnosis not present

## 2018-02-20 DIAGNOSIS — J302 Other seasonal allergic rhinitis: Secondary | ICD-10-CM | POA: Diagnosis not present

## 2018-02-20 DIAGNOSIS — F5101 Primary insomnia: Secondary | ICD-10-CM | POA: Diagnosis not present

## 2018-02-20 DIAGNOSIS — R351 Nocturia: Secondary | ICD-10-CM | POA: Diagnosis not present

## 2018-02-20 LAB — HM HEPATITIS C SCREENING LAB: HM Hepatitis Screen: NEGATIVE

## 2018-02-20 MED FILL — ZOLPIDEM TART ER 12.5 MG TA: 12.5 | 30 days supply | Qty: 30 | Fill #1

## 2018-03-08 DIAGNOSIS — F4323 Adjustment disorder with mixed anxiety and depressed mood: Secondary | ICD-10-CM | POA: Diagnosis not present

## 2018-03-22 DIAGNOSIS — F4323 Adjustment disorder with mixed anxiety and depressed mood: Secondary | ICD-10-CM | POA: Diagnosis not present

## 2018-03-24 DIAGNOSIS — F4323 Adjustment disorder with mixed anxiety and depressed mood: Secondary | ICD-10-CM | POA: Diagnosis not present

## 2018-03-24 MED FILL — ZOLPIDEM TART ER 12.5 MG TA: 12.5 | 30 days supply | Qty: 30 | Fill #2

## 2018-04-08 DIAGNOSIS — F4323 Adjustment disorder with mixed anxiety and depressed mood: Secondary | ICD-10-CM | POA: Diagnosis not present

## 2018-04-12 DIAGNOSIS — F4323 Adjustment disorder with mixed anxiety and depressed mood: Secondary | ICD-10-CM | POA: Diagnosis not present

## 2018-05-03 DIAGNOSIS — F4323 Adjustment disorder with mixed anxiety and depressed mood: Secondary | ICD-10-CM | POA: Diagnosis not present

## 2018-05-05 DIAGNOSIS — F4323 Adjustment disorder with mixed anxiety and depressed mood: Secondary | ICD-10-CM | POA: Diagnosis not present

## 2018-05-11 MED FILL — ZOLPIDEM TART ER 12.5 MG TA: 12.5 | 30 days supply | Qty: 30 | Fill #3

## 2018-05-12 DIAGNOSIS — F4323 Adjustment disorder with mixed anxiety and depressed mood: Secondary | ICD-10-CM | POA: Diagnosis not present

## 2018-06-03 DIAGNOSIS — F4323 Adjustment disorder with mixed anxiety and depressed mood: Secondary | ICD-10-CM | POA: Diagnosis not present

## 2018-06-07 DIAGNOSIS — L814 Other melanin hyperpigmentation: Secondary | ICD-10-CM | POA: Diagnosis not present

## 2018-06-07 DIAGNOSIS — L989 Disorder of the skin and subcutaneous tissue, unspecified: Secondary | ICD-10-CM | POA: Diagnosis not present

## 2018-06-07 DIAGNOSIS — L570 Actinic keratosis: Secondary | ICD-10-CM | POA: Diagnosis not present

## 2018-06-07 DIAGNOSIS — D1801 Hemangioma of skin and subcutaneous tissue: Secondary | ICD-10-CM | POA: Diagnosis not present

## 2018-06-07 DIAGNOSIS — D485 Neoplasm of uncertain behavior of skin: Secondary | ICD-10-CM | POA: Diagnosis not present

## 2018-06-07 DIAGNOSIS — D225 Melanocytic nevi of trunk: Secondary | ICD-10-CM | POA: Diagnosis not present

## 2018-06-10 DIAGNOSIS — F4323 Adjustment disorder with mixed anxiety and depressed mood: Secondary | ICD-10-CM | POA: Diagnosis not present

## 2018-06-17 DIAGNOSIS — F4323 Adjustment disorder with mixed anxiety and depressed mood: Secondary | ICD-10-CM | POA: Diagnosis not present

## 2018-06-19 MED FILL — ZOLPIDEM TART ER 12.5 MG TA: 12.5 | 30 days supply | Qty: 30 | Fill #0

## 2018-06-28 DIAGNOSIS — F4323 Adjustment disorder with mixed anxiety and depressed mood: Secondary | ICD-10-CM | POA: Diagnosis not present

## 2018-07-03 ENCOUNTER — Emergency Department (INDEPENDENT_AMBULATORY_CARE_PROVIDER_SITE_OTHER)
Admission: EM | Admit: 2018-07-03 | Discharge: 2018-07-03 | Disposition: A | Discharge status: Home / Self Care | Payer: 59 | Source: Home / Self Care | Attending: Family Medicine | Admitting: Family Medicine

## 2018-07-03 ENCOUNTER — Encounter: Payer: Self-pay | Admitting: Emergency Medicine

## 2018-07-03 ENCOUNTER — Other Ambulatory Visit: Payer: Self-pay

## 2018-07-03 DIAGNOSIS — S0181XA Laceration without foreign body of other part of head, initial encounter: Secondary | ICD-10-CM

## 2018-07-03 NOTE — Discharge Instructions (Addendum)
Keep wound clean and dry.  Return for any signs of infection (or follow-up with family doctor):  Increasing redness, swelling, pain, heat, drainage, etc. °Follow instructions on Dermabond information sheet.  °

## 2018-07-03 NOTE — ED Triage Notes (Signed)
Small laceration 1.5 cm over Right eye, ran into archway in bathroom this am. Tetanus is up to date

## 2018-07-03 NOTE — ED Provider Notes (Signed)
[facility]    CSN: [id number] Arrival date & time: 07/03/18  0919     History   Chief Complaint Chief Complaint  Patient presents with  . Laceration    HPI Mark Osborn is a 57 y.o. male.   Patient bumped his forehead on a doorjamb this morning resulting in a superficial laceration.  No loss of consciousness or headache.  Last Tdap was 12/19/13.  The history is provided by the patient.  Laceration  Location: forehead. Length:  1.5cm Depth:  Through dermis Quality: straight   Bleeding: controlled   Time since incident:  5 hours Injury mechanism: wooden door jamb. Pain details:    Quality:  Aching   Severity:  Mild   Timing:  Constant   Progression:  Improving Foreign body present:  No foreign bodies Relieved by:  Nothing Worsened by:  Nothing Ineffective treatments:  None tried Tetanus status:  Up to date Associated symptoms: no swelling     Past Medical History:  Diagnosis Date  .  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 Allergy   . Family history of BRCA2 gene positive   . Family history of breast cancer   . Personal history of colonic polyps 01/30/2014  . Tubular adenoma of colon 01/29/2014   [facility]    Patient Active Problem List   Diagnosis Date Noted  . Genetic testing 03/10/2016  .  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 Family history of breast cancer   . Family history of BRCA2 gene positive   . History of colonic polyps 01/30/2014  . Seasonal allergies 02/22/2011    Past Surgical History:  Procedure Laterality Date  . COLONOSCOPY W/ BIOPSIES    . HERNIA REPAIR  1989  . MOUTH SURGERY    . TONSILLECTOMY         Home Medications    Prior to Admission medications   Medication Sig Start Date End Date Taking? Authorizing Provider  cetirizine (ZYRTEC) 10 MG tablet Take 10 mg by mouth daily.    [provider]  fluticasone (FLONASE) 50 MCG/ACT nasal  spray Place 2 sprays into both nostrils daily for 10 days. 01/12/18 01/22/18  Kara Dies, NP  montelukast (SINGULAIR) 10 MG tablet Take 1 tablet (10 mg total) by mouth at bedtime. 01/12/18   Kara Dies, NP  zolpidem (AMBIEN) 10 MG tablet Take 5 mg by mouth at bedtime as needed for sleep.    [provider]    Family History Family History  Problem Relation Age of Onset  . Breast cancer Mother 83       APC I1307K moderate risk mutation  . Crohn's disease Mother   . BRCA 1/2 Father        BRCA2+  . BRCA 1/2 Sister        BRCA2+  . Breast cancer Paternal Aunt        dx in her 2s  . Diabetes Maternal Grandmother   . Heart disease Maternal Grandmother   . Parkinson's disease Maternal Grandfather   . Breast cancer Paternal Grandmother        dx in her 9s  . Stomach cancer Paternal Grandfather   . BRCA 1/2 Sister        BRCA2 neg  . Esophageal cancer Maternal Uncle        dx in his 45s; smoker  . Breast cancer Cousin 73  . BRCA 1/2  Cousin        BRCA2+  . Colon cancer Neg Hx     Social History Social History   Tobacco Use  . Smoking status: Never Smoker  . Smokeless tobacco: Never Used  Substance Use Topics  . Alcohol use: Yes    Alcohol/week: 2.0 standard drinks    Types: 2 Cans of beer per week    Comment: beer weekly  . Drug use: No     Allergies   Monosodium glutamate; Other; and Penicillins   Review of Systems Review of Systems  Eyes: Negative for visual disturbance.  Neurological: Negative for dizziness, light-headedness and headaches.  All other systems reviewed and are negative.    Physical Exam Triage Vital Signs ED Triage Vitals  Enc Vitals Group     BP 07/03/18 0942 113/73     Pulse Rate 07/03/18 0942 81     Resp --      Temp 07/03/18 0942 98.5 F (36.9 C)     Temp Source 07/03/18 0942 Oral     SpO2 07/03/18 0942 97 %     Weight 07/03/18 0943 135 lb (61.2 kg)     Height 07/03/18 0943 _0  (1.676 m)     Head  Circumference --      Peak Flow --      Pain Score 07/03/18 0942 1     Pain Loc --      Pain Edu? --      Excl. in [place]? --    No data found.  Updated Vital Signs BP 113/73 (BP Location: Right Arm)   Pulse 81   Temp 98.5 F (36.9 C) (Oral)   Ht _1  (1.676 m)   Wt 61.2 kg   SpO2 97%   BMI 21.79 kg/m   Visual Acuity Right Eye Distance:   Left Eye Distance:   Bilateral Distance:    Right Eye Near:   Left Eye Near:    Bilateral Near:     Physical Exam  Constitutional: He is oriented to person, place, and time. He appears well-developed and well-nourished.  HENT:  Head: Head is with laceration. Head is without right periorbital erythema.    Right Ear: External ear normal.  Left Ear: External ear normal.  Nose: Nose normal.  Mouth/Throat: Oropharynx is clear and moist.  Just above the right eye is a 1.5cm long simple laceration without surrounding swelling.  No evidence depressed skull fracture.  Eyes: Pupils are equal, round, and reactive to light. Conjunctivae and EOM are normal.  Cardiovascular: Normal rate.  Pulmonary/Chest: Effort normal.  Neurological: He is alert and oriented to person, place, and time.  Skin: Skin is warm and dry.  Nursing note and vitals reviewed.    UC Treatments / Results  Labs (all labs ordered are listed, but only abnormal results are displayed) Labs Reviewed - No data to display  EKG None  Radiology No results found.  Procedures Procedures  Laceration Repair (Dermabond) Discussed benefits and risks of procedure and verbal consent obtained. Using sterile technique, cleansed wound with Betadine followed by copious lavage with normal saline.  Wound carefully inspected for debris and foreign bodies; none found.  Wound edges carefully approximated in normal anatomic position and closed with Dermabond.  Wound precautions explained to patient.     Medications Ordered in UC Medications - No data to display  Initial Impression /  Assessment and Plan / UC Course  I have reviewed the triage vital signs and the nursing  notes.  Pertinent labs & imaging results that were available during my care of the patient were reviewed by me and considered in my medical decision making (see chart for details).       Final Clinical Impressions(s) / UC Diagnoses   Final diagnoses:  Laceration of forehead, initial encounter     Discharge Instructions     Keep wound clean and dry.  Return for any signs of infection (or follow-up with family doctor):  Increasing redness, swelling, pain, heat, drainage, etc. Follow instructions on Dermabond information sheet.     ED Prescriptions    None         Kandra Nicolas, MD 07/03/18 1040

## 2018-07-08 DIAGNOSIS — F4323 Adjustment disorder with mixed anxiety and depressed mood: Secondary | ICD-10-CM | POA: Diagnosis not present

## 2018-07-17 DIAGNOSIS — H524 Presbyopia: Secondary | ICD-10-CM | POA: Diagnosis not present

## 2018-07-19 DIAGNOSIS — F4323 Adjustment disorder with mixed anxiety and depressed mood: Secondary | ICD-10-CM | POA: Diagnosis not present

## 2018-07-20 MED FILL — ZOLPIDEM TART ER 12.5 MG TA: 12.5 | 30 days supply | Qty: 30 | Fill #1

## 2018-07-22 DIAGNOSIS — F4323 Adjustment disorder with mixed anxiety and depressed mood: Secondary | ICD-10-CM | POA: Diagnosis not present

## 2018-08-04 DIAGNOSIS — L905 Scar conditions and fibrosis of skin: Secondary | ICD-10-CM | POA: Diagnosis not present

## 2018-08-04 DIAGNOSIS — D485 Neoplasm of uncertain behavior of skin: Secondary | ICD-10-CM | POA: Diagnosis not present

## 2018-08-04 DIAGNOSIS — F4323 Adjustment disorder with mixed anxiety and depressed mood: Secondary | ICD-10-CM | POA: Diagnosis not present

## 2018-08-07 DIAGNOSIS — F4323 Adjustment disorder with mixed anxiety and depressed mood: Secondary | ICD-10-CM | POA: Diagnosis not present

## 2018-08-22 MED FILL — ZOLPIDEM TART ER 12.5 MG TA: 12.5 | 30 days supply | Qty: 30 | Fill #2

## 2018-08-30 DIAGNOSIS — F4323 Adjustment disorder with mixed anxiety and depressed mood: Secondary | ICD-10-CM | POA: Diagnosis not present

## 2018-09-05 DIAGNOSIS — J019 Acute sinusitis, unspecified: Secondary | ICD-10-CM | POA: Diagnosis not present

## 2018-09-11 DIAGNOSIS — F4323 Adjustment disorder with mixed anxiety and depressed mood: Secondary | ICD-10-CM | POA: Diagnosis not present

## 2018-09-18 MED FILL — ZOLPIDEM TART ER 12.5 MG TA: 12.5 | 30 days supply | Qty: 30 | Fill #3

## 2018-10-04 DIAGNOSIS — F4323 Adjustment disorder with mixed anxiety and depressed mood: Secondary | ICD-10-CM | POA: Diagnosis not present

## 2018-10-07 DIAGNOSIS — F4323 Adjustment disorder with mixed anxiety and depressed mood: Secondary | ICD-10-CM | POA: Diagnosis not present

## 2018-10-16 DIAGNOSIS — F4323 Adjustment disorder with mixed anxiety and depressed mood: Secondary | ICD-10-CM | POA: Diagnosis not present

## 2018-10-20 MED FILL — ZOLPIDEM TART ER 12.5 MG TA: 12.5 | 30 days supply | Qty: 30 | Fill #0

## 2018-10-23 DIAGNOSIS — F4323 Adjustment disorder with mixed anxiety and depressed mood: Secondary | ICD-10-CM | POA: Diagnosis not present

## 2018-11-01 DIAGNOSIS — F4323 Adjustment disorder with mixed anxiety and depressed mood: Secondary | ICD-10-CM | POA: Diagnosis not present

## 2018-11-21 MED FILL — ZOLPIDEM TART ER 12.5 MG TA: 12.5 | 30 days supply | Qty: 30 | Fill #0

## 2018-11-24 DIAGNOSIS — F4323 Adjustment disorder with mixed anxiety and depressed mood: Secondary | ICD-10-CM | POA: Diagnosis not present

## 2018-12-14 DIAGNOSIS — F4323 Adjustment disorder with mixed anxiety and depressed mood: Secondary | ICD-10-CM | POA: Diagnosis not present

## 2018-12-20 MED FILL — ZOLPIDEM TART ER 12.5 MG TA: 12.5 | 30 days supply | Qty: 30 | Fill #0

## 2018-12-22 DIAGNOSIS — D225 Melanocytic nevi of trunk: Secondary | ICD-10-CM | POA: Diagnosis not present

## 2018-12-22 DIAGNOSIS — Z872 Personal history of diseases of the skin and subcutaneous tissue: Secondary | ICD-10-CM | POA: Diagnosis not present

## 2018-12-22 DIAGNOSIS — D485 Neoplasm of uncertain behavior of skin: Secondary | ICD-10-CM | POA: Diagnosis not present

## 2018-12-22 DIAGNOSIS — L812 Freckles: Secondary | ICD-10-CM | POA: Diagnosis not present

## 2018-12-22 DIAGNOSIS — L814 Other melanin hyperpigmentation: Secondary | ICD-10-CM | POA: Diagnosis not present

## 2018-12-22 DIAGNOSIS — L821 Other seborrheic keratosis: Secondary | ICD-10-CM | POA: Diagnosis not present

## 2018-12-22 DIAGNOSIS — D1801 Hemangioma of skin and subcutaneous tissue: Secondary | ICD-10-CM | POA: Diagnosis not present

## 2019-01-05 DIAGNOSIS — F4323 Adjustment disorder with mixed anxiety and depressed mood: Secondary | ICD-10-CM | POA: Diagnosis not present

## 2019-01-15 MED FILL — ZOLPIDEM TART ER 12.5 MG TA: 12.5 | 30 days supply | Qty: 30 | Fill #1

## 2019-01-23 DIAGNOSIS — F4323 Adjustment disorder with mixed anxiety and depressed mood: Secondary | ICD-10-CM | POA: Diagnosis not present

## 2019-02-07 DIAGNOSIS — F4323 Adjustment disorder with mixed anxiety and depressed mood: Secondary | ICD-10-CM | POA: Diagnosis not present

## 2019-02-26 DIAGNOSIS — F4323 Adjustment disorder with mixed anxiety and depressed mood: Secondary | ICD-10-CM | POA: Diagnosis not present

## 2019-03-09 DIAGNOSIS — F4323 Adjustment disorder with mixed anxiety and depressed mood: Secondary | ICD-10-CM | POA: Diagnosis not present

## 2019-03-23 DIAGNOSIS — F4323 Adjustment disorder with mixed anxiety and depressed mood: Secondary | ICD-10-CM | POA: Diagnosis not present

## 2019-04-06 DIAGNOSIS — F4323 Adjustment disorder with mixed anxiety and depressed mood: Secondary | ICD-10-CM | POA: Diagnosis not present

## 2019-04-20 DIAGNOSIS — F4323 Adjustment disorder with mixed anxiety and depressed mood: Secondary | ICD-10-CM | POA: Diagnosis not present

## 2019-04-23 DIAGNOSIS — Z0000 Encounter for general adult medical examination without abnormal findings: Secondary | ICD-10-CM | POA: Diagnosis not present

## 2019-04-23 DIAGNOSIS — Z86010 Personal history of colonic polyps: Secondary | ICD-10-CM | POA: Diagnosis not present

## 2019-04-23 DIAGNOSIS — F5101 Primary insomnia: Secondary | ICD-10-CM | POA: Diagnosis not present

## 2019-04-23 DIAGNOSIS — R351 Nocturia: Secondary | ICD-10-CM | POA: Diagnosis not present

## 2019-04-23 DIAGNOSIS — J302 Other seasonal allergic rhinitis: Secondary | ICD-10-CM | POA: Diagnosis not present

## 2019-05-09 DIAGNOSIS — F4323 Adjustment disorder with mixed anxiety and depressed mood: Secondary | ICD-10-CM | POA: Diagnosis not present

## 2019-05-31 MED FILL — ZOLPIDEM TART ER 12.5 MG TA: 12.5 | 30 days supply | Qty: 30 | Fill #2

## 2019-06-01 DIAGNOSIS — F4323 Adjustment disorder with mixed anxiety and depressed mood: Secondary | ICD-10-CM | POA: Diagnosis not present

## 2019-06-15 DIAGNOSIS — L905 Scar conditions and fibrosis of skin: Secondary | ICD-10-CM | POA: Diagnosis not present

## 2019-06-15 DIAGNOSIS — Z872 Personal history of diseases of the skin and subcutaneous tissue: Secondary | ICD-10-CM | POA: Diagnosis not present

## 2019-06-15 DIAGNOSIS — D485 Neoplasm of uncertain behavior of skin: Secondary | ICD-10-CM | POA: Diagnosis not present

## 2019-06-15 DIAGNOSIS — L814 Other melanin hyperpigmentation: Secondary | ICD-10-CM | POA: Diagnosis not present

## 2019-06-20 DIAGNOSIS — F4323 Adjustment disorder with mixed anxiety and depressed mood: Secondary | ICD-10-CM | POA: Diagnosis not present

## 2019-06-29 MED FILL — ZOLPIDEM TART ER 12.5 MG TA: 12.5 | 30 days supply | Qty: 30 | Fill #0

## 2019-07-04 DIAGNOSIS — F4323 Adjustment disorder with mixed anxiety and depressed mood: Secondary | ICD-10-CM | POA: Diagnosis not present

## 2019-07-13 DIAGNOSIS — F4323 Adjustment disorder with mixed anxiety and depressed mood: Secondary | ICD-10-CM | POA: Diagnosis not present

## 2019-07-20 DIAGNOSIS — F4323 Adjustment disorder with mixed anxiety and depressed mood: Secondary | ICD-10-CM | POA: Diagnosis not present

## 2019-07-31 MED FILL — ZOLPIDEM TART ER 12.5 MG TA: 12.5 | 30 days supply | Qty: 30 | Fill #1

## 2019-08-09 DIAGNOSIS — F4323 Adjustment disorder with mixed anxiety and depressed mood: Secondary | ICD-10-CM | POA: Diagnosis not present

## 2019-08-20 DIAGNOSIS — H524 Presbyopia: Secondary | ICD-10-CM | POA: Diagnosis not present

## 2019-08-22 ENCOUNTER — Telehealth: Payer: Self-pay | Admitting: Internal Medicine

## 2019-08-22 NOTE — Telephone Encounter (Signed)
OK to do colonoscopy in December  Hx polyps  APC mutation

## 2019-08-24 DIAGNOSIS — F4323 Adjustment disorder with mixed anxiety and depressed mood: Secondary | ICD-10-CM | POA: Diagnosis not present

## 2019-08-28 MED FILL — ZOLPIDEM TART ER 12.5 MG TA: 12.5 | 30 days supply | Qty: 30 | Fill #2

## 2019-09-07 DIAGNOSIS — F4323 Adjustment disorder with mixed anxiety and depressed mood: Secondary | ICD-10-CM | POA: Diagnosis not present

## 2019-09-11 ENCOUNTER — Encounter: Payer: Self-pay | Admitting: Internal Medicine

## 2019-09-25 DIAGNOSIS — F4323 Adjustment disorder with mixed anxiety and depressed mood: Secondary | ICD-10-CM | POA: Diagnosis not present

## 2019-09-28 DIAGNOSIS — D229 Melanocytic nevi, unspecified: Secondary | ICD-10-CM | POA: Diagnosis not present

## 2019-09-28 DIAGNOSIS — Z872 Personal history of diseases of the skin and subcutaneous tissue: Secondary | ICD-10-CM | POA: Diagnosis not present

## 2019-09-28 DIAGNOSIS — L821 Other seborrheic keratosis: Secondary | ICD-10-CM | POA: Diagnosis not present

## 2019-09-28 DIAGNOSIS — L570 Actinic keratosis: Secondary | ICD-10-CM | POA: Diagnosis not present

## 2019-09-28 DIAGNOSIS — L812 Freckles: Secondary | ICD-10-CM | POA: Diagnosis not present

## 2019-09-29 MED FILL — ZOLPIDEM TART ER 12.5 MG TA: 12.5 | 30 days supply | Qty: 30 | Fill #3

## 2019-10-04 ENCOUNTER — Other Ambulatory Visit: Payer: Self-pay

## 2019-10-04 ENCOUNTER — Encounter: Payer: Self-pay | Admitting: Internal Medicine

## 2019-10-04 ENCOUNTER — Ambulatory Visit (AMBULATORY_SURGERY_CENTER): Payer: 59 | Admitting: *Deleted

## 2019-10-04 VITALS — Temp 96.8°F | Ht 65.0 in | Wt 135.0 lb

## 2019-10-04 DIAGNOSIS — Z86010 Personal history of colonic polyps: Secondary | ICD-10-CM

## 2019-10-04 DIAGNOSIS — Z1589 Genetic susceptibility to other disease: Secondary | ICD-10-CM

## 2019-10-04 DIAGNOSIS — Z1159 Encounter for screening for other viral diseases: Secondary | ICD-10-CM

## 2019-10-04 MED ORDER — SUPREP BOWEL PREP KIT 17.5-3.13-1.6 GM/177ML PO SOLN: 1.0000 | Freq: Once | ORAL | 0 refills | Status: AC

## 2019-10-04 MED FILL — SUPREP BOWEL PREP KIT: 17.5-3.13-1 | 1 days supply | Qty: 354 | Fill #0

## 2019-10-04 NOTE — Progress Notes (Signed)

## 2019-10-12 ENCOUNTER — Ambulatory Visit (INDEPENDENT_AMBULATORY_CARE_PROVIDER_SITE_OTHER): Payer: 59

## 2019-10-12 ENCOUNTER — Other Ambulatory Visit: Payer: Self-pay | Admitting: Internal Medicine

## 2019-10-12 DIAGNOSIS — Z1159 Encounter for screening for other viral diseases: Secondary | ICD-10-CM

## 2019-10-12 DIAGNOSIS — F4323 Adjustment disorder with mixed anxiety and depressed mood: Secondary | ICD-10-CM | POA: Diagnosis not present

## 2019-10-12 LAB — SARS CORONAVIRUS 2 (TAT 6-24 HRS): SARS Coronavirus 2: NEGATIVE

## 2019-10-17 ENCOUNTER — Other Ambulatory Visit: Payer: Self-pay

## 2019-10-17 ENCOUNTER — Ambulatory Visit (AMBULATORY_SURGERY_CENTER): Payer: 59 | Admitting: Internal Medicine

## 2019-10-17 ENCOUNTER — Encounter: Payer: Self-pay | Admitting: Internal Medicine

## 2019-10-17 VITALS — BP 85/60 | HR 71 | Temp 97.8°F | Resp 13 | Ht 65.0 in | Wt 135.0 lb

## 2019-10-17 DIAGNOSIS — F4323 Adjustment disorder with mixed anxiety and depressed mood: Secondary | ICD-10-CM | POA: Diagnosis not present

## 2019-10-17 DIAGNOSIS — D127 Benign neoplasm of rectosigmoid junction: Secondary | ICD-10-CM | POA: Diagnosis not present

## 2019-10-17 DIAGNOSIS — D128 Benign neoplasm of rectum: Secondary | ICD-10-CM | POA: Diagnosis not present

## 2019-10-17 DIAGNOSIS — D125 Benign neoplasm of sigmoid colon: Secondary | ICD-10-CM

## 2019-10-17 DIAGNOSIS — Z86010 Personal history of colonic polyps: Secondary | ICD-10-CM

## 2019-10-17 MED ORDER — SODIUM CHLORIDE 0.9 % IV SOLN: 500.0000 mL | INTRAVENOUS | Status: DC

## 2019-10-17 NOTE — Op Note (Signed)
[name] Patient Name: Mark Osborn Procedure Date: 10/17/2019 9:13 AM MRN: [id number] Endoscopist: Gatha Mayer , MD Age: 58 Referring MD:  Date of Birth: [birth year] Gender: Male Account #: [id number] Procedure:                Colonoscopy Indications:              Surveillance: Personal history of adenomatous                            polyps on last colonoscopy 3 years ago Medicines:                Propofol per Anesthesia, Monitored Anesthesia Care Procedure:                Pre-Anesthesia Assessment:                           - Prior to the procedure, a History and Physical                            was performed, and patient medications and                            allergies were reviewed. The patient's tolerance of                            previous anesthesia was also reviewed. The risks                            and benefits of the procedure and the sedation                            options and risks were discussed with the patient.                            All questions were answered, and informed consent                            was obtained. Prior Anticoagulants: The patient has                            taken no previous anticoagulant or antiplatelet                            agents. ASA Grade Assessment: II - A patient with                            mild systemic disease. After reviewing the risks                            and benefits, the patient was deemed in                            satisfactory condition to undergo the procedure.  After obtaining informed consent, the colonoscope                            was passed under direct vision. Throughout the                            procedure, the patient's blood pressure, pulse, and                            oxygen saturations were monitored continuously. The                            Colonoscope was introduced through the anus and   advanced to the the cecum, identified by                            appendiceal orifice and ileocecal valve. The                            colonoscopy was performed without difficulty. The                            patient tolerated the procedure well. The quality                            of the bowel preparation was excellent. The                            ileocecal valve, appendiceal orifice, and rectum                            were photographed. The bowel preparation used was                            SUPREP via split dose instruction. Scope In: 9:33:30 AM Scope Out: 9:47:59 AM Scope Withdrawal Time: 0 hours 10 minutes 54 seconds  Total Procedure Duration: 0 hours 14 minutes 29 seconds  Findings:                 The perianal and digital rectal examinations were                            normal. Pertinent negatives include normal prostate                            (size, shape, and consistency).                           Two sessile polyps were found in the rectum and                            sigmoid colon. The polyps were diminutive in size.                            These polyps were removed  with a cold snare.                            Resection and retrieval were complete. Verification                            of patient identification for the specimen was                            done. Estimated blood loss was minimal.                           The exam was otherwise without abnormality on                            direct and retroflexion views. Complications:            No immediate complications. Estimated Blood Loss:     Estimated blood loss was minimal. Impression:               - Two diminutive polyps in the rectum and in the                            sigmoid colon, removed with a cold snare. Resected                            and retrieved.                           - The examination was otherwise normal on direct                            and  retroflexion views.                           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 Recommendation:           - Patient has a contact number available for                            emergencies. The signs and symptoms of potential  delayed complications were discussed with the                            patient. Return to normal activities tomorrow.                            Written discharge instructions were provided to the                            patient.                           - Resume previous diet.                           - Continue present medications.                           - Repeat colonoscopy is recommended for                            surveillance. The colonoscopy date will be                            determined after pathology results from today's                            exam become available for review. Gatha Mayer, MD 10/17/2019 9:58:06 AM This report has been signed electronically.

## 2019-10-17 NOTE — Progress Notes (Signed)
Report to PACU, RN, vss, BBS= Clear.  

## 2019-10-17 NOTE — Patient Instructions (Addendum)
I found and removed 2 tiny polyps. I will let you know pathology results and when to have another routine colonoscopy by mail and/or My Chart.  I appreciate the opportunity to care for you. Gatha Mayer, MD, [facility]  .YOU HAD AN ENDOSCOPIC PROCEDURE TODAY AT [facility]:   Refer to the procedure report that was given to you for any specific questions about what was found during the examination.  If the procedure report does not answer your questions, please call your gastroenterologist to clarify.  If you requested that your care partner not be given the details of your procedure findings, then the procedure report has been included in a sealed envelope for you to review at your convenience later.  YOU SHOULD EXPECT: Some feelings of bloating in the abdomen. Passage of more gas than usual.  Walking can help get rid of the air that was put into your GI tract during the procedure and reduce the bloating. If you had a lower endoscopy (such as a colonoscopy or flexible sigmoidoscopy) you may notice spotting of blood in your stool or on the toilet paper. If you underwent a bowel prep for your procedure, you may not have a normal bowel movement for a few days.  Please Note:  You might notice some irritation and congestion in your nose or some drainage.  This is from the oxygen used during your procedure.  There is no need for concern and it should clear up in a day or so.  SYMPTOMS TO REPORT IMMEDIATELY:   Following lower endoscopy (colonoscopy or flexible sigmoidoscopy):  Excessive amounts of blood in the stool  Significant tenderness or worsening of abdominal pains  Swelling of the abdomen that is new, acute  Fever of 100F or higher  For urgent or emergent issues, a gastroenterologist can be reached at any hour by calling [phone].  DIET:  We do recommend a small meal at first, but then you may proceed to your regular diet.  Drink plenty of fluids but you should avoid  alcoholic beverages for 24 hours.  ACTIVITY:  You should plan to take it easy for the rest of today and you should NOT DRIVE or use heavy machinery until tomorrow (because of the sedation medicines used during the test).    FOLLOW UP: Our staff will call the number listed on your records 48-72 hours following your procedure to check on you and address any questions or concerns that you may have regarding the information given to you following your procedure. If we do not reach you, we will leave a message.  We will attempt to reach you two times.  During this call, we will ask if you have developed any symptoms of COVID 19. If you develop any symptoms (ie: fever, flu-like symptoms, shortness of breath, cough etc.) before then, please call [phone].  If you test positive for Covid 19 in the 2 weeks post procedure, please call and report this information to [place].    If any biopsies were taken you will be contacted by phone or by letter within the next 1-3 weeks.  Please call us at [phone] if you have not heard about the biopsies in 3 weeks.    SIGNATURES/CONFIDENTIALITY: You and/or your care partner have signed paperwork which will be entered into your electronic medical record.  These signatures attest to the fact that that the information above on your After Visit Summary has been reviewed and is understood.  Full responsibility of  the confidentiality of this discharge information lies with you and/or your care-partner.  Await pathology  Continue your normal medications  Please read over handout about polyps

## 2019-10-22 ENCOUNTER — Telehealth: Payer: Self-pay | Admitting: *Deleted

## 2019-10-22 NOTE — Telephone Encounter (Signed)
  Follow up Call-  Call back number 10/17/2019 01/31/2017  Post procedure Call Back phone  # [phone]  Permission to leave phone message Yes Yes  Some recent data might be hidden     1. Patient questions:Have you developed a fever since your procedure? no  2.   Have you had an respiratory symptoms (SOB or cough) since your procedure? no  3.   Have you tested positive for COVID 19 since your procedure no  4.   Have you had any family members/close contacts diagnosed with the COVID 19 since your procedure?  no   If yes to any of these questions please route to Joylene John, RN and Alphonsa Gin, Therapist, sports.   Do you have a fever, pain , or abdominal swelling? No. Pain Score  0 *  Have you tolerated food without any problems? Yes.    Have you been able to return to your normal activities? Yes.    Do you have any questions about your discharge instructions: Diet   No. Medications  No. Follow up visit  No.  Do you have questions or concerns about your Care? No.  Actions: * If pain score is 4 or above: No action needed, pain <4.

## 2019-10-25 ENCOUNTER — Encounter: Payer: Self-pay | Admitting: Internal Medicine

## 2019-10-30 MED FILL — ZOLPIDEM TART ER 12.5 MG TA: 12.5 | 30 days supply | Qty: 30 | Fill #0

## 2019-10-31 DIAGNOSIS — F4323 Adjustment disorder with mixed anxiety and depressed mood: Secondary | ICD-10-CM | POA: Diagnosis not present

## 2019-11-21 DIAGNOSIS — F4323 Adjustment disorder with mixed anxiety and depressed mood: Secondary | ICD-10-CM | POA: Diagnosis not present

## 2019-11-27 MED FILL — ZOLPIDEM TART ER 12.5 MG TA: 12.5 | 30 days supply | Qty: 30 | Fill #0

## 2019-12-12 DIAGNOSIS — F4323 Adjustment disorder with mixed anxiety and depressed mood: Secondary | ICD-10-CM | POA: Diagnosis not present

## 2019-12-26 MED FILL — ZOLPIDEM TART ER 12.5 MG TA: 12.5 | 30 days supply | Qty: 30 | Fill #1

## 2019-12-31 DIAGNOSIS — F4323 Adjustment disorder with mixed anxiety and depressed mood: Secondary | ICD-10-CM | POA: Diagnosis not present

## 2020-01-14 DIAGNOSIS — F4323 Adjustment disorder with mixed anxiety and depressed mood: Secondary | ICD-10-CM | POA: Diagnosis not present

## 2020-01-24 MED FILL — ZOLPIDEM TART ER 12.5 MG TA: 12.5 | 30 days supply | Qty: 30 | Fill #2

## 2020-01-31 ENCOUNTER — Ambulatory Visit: Payer: 59

## 2020-01-31 DIAGNOSIS — F4323 Adjustment disorder with mixed anxiety and depressed mood: Secondary | ICD-10-CM | POA: Diagnosis not present

## 2020-02-19 DIAGNOSIS — F4323 Adjustment disorder with mixed anxiety and depressed mood: Secondary | ICD-10-CM | POA: Diagnosis not present

## 2020-02-20 MED FILL — ZOLPIDEM TART ER 12.5 MG TA: 12.5 | 30 days supply | Qty: 30 | Fill #0

## 2020-03-17 DIAGNOSIS — F4323 Adjustment disorder with mixed anxiety and depressed mood: Secondary | ICD-10-CM | POA: Diagnosis not present

## 2020-03-18 ENCOUNTER — Other Ambulatory Visit: Payer: Self-pay | Admitting: *Deleted

## 2020-03-21 MED FILL — ZOLPIDEM TART ER 12.5 MG TA: 12.5 | 30 days supply | Qty: 30 | Fill #1

## 2020-03-31 DIAGNOSIS — Z872 Personal history of diseases of the skin and subcutaneous tissue: Secondary | ICD-10-CM | POA: Diagnosis not present

## 2020-03-31 DIAGNOSIS — D485 Neoplasm of uncertain behavior of skin: Secondary | ICD-10-CM | POA: Diagnosis not present

## 2020-03-31 DIAGNOSIS — L905 Scar conditions and fibrosis of skin: Secondary | ICD-10-CM | POA: Diagnosis not present

## 2020-03-31 DIAGNOSIS — D2262 Melanocytic nevi of left upper limb, including shoulder: Secondary | ICD-10-CM | POA: Diagnosis not present

## 2020-03-31 DIAGNOSIS — D229 Melanocytic nevi, unspecified: Secondary | ICD-10-CM | POA: Diagnosis not present

## 2020-03-31 DIAGNOSIS — L821 Other seborrheic keratosis: Secondary | ICD-10-CM | POA: Diagnosis not present

## 2020-04-02 DIAGNOSIS — F4323 Adjustment disorder with mixed anxiety and depressed mood: Secondary | ICD-10-CM | POA: Diagnosis not present

## 2020-04-07 ENCOUNTER — Other Ambulatory Visit: Payer: Self-pay | Admitting: *Deleted

## 2020-04-07 DIAGNOSIS — I83893 Varicose veins of bilateral lower extremities with other complications: Secondary | ICD-10-CM

## 2020-04-10 ENCOUNTER — Encounter: Payer: Self-pay | Admitting: Vascular Surgery

## 2020-04-10 ENCOUNTER — Ambulatory Visit (INDEPENDENT_AMBULATORY_CARE_PROVIDER_SITE_OTHER): Payer: 59 | Admitting: Vascular Surgery

## 2020-04-10 ENCOUNTER — Other Ambulatory Visit: Payer: Self-pay

## 2020-04-10 ENCOUNTER — Ambulatory Visit (HOSPITAL_COMMUNITY)
Admission: RE | Admit: 2020-04-10 | Discharge: 2020-04-10 | Disposition: A | Discharge status: Home / Self Care | Payer: 59 | Source: Ambulatory Visit | Attending: Vascular Surgery | Admitting: Vascular Surgery

## 2020-04-10 VITALS — BP 100/67 | HR 66 | Temp 98.0°F | Resp 18 | Ht 65.5 in | Wt 132.1 lb

## 2020-04-10 DIAGNOSIS — I872 Venous insufficiency (chronic) (peripheral): Secondary | ICD-10-CM

## 2020-04-10 DIAGNOSIS — I83893 Varicose veins of bilateral lower extremities with other complications: Secondary | ICD-10-CM | POA: Insufficient documentation

## 2020-04-10 NOTE — Progress Notes (Signed)
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  The patient is self-referred.  ASSESSMENT & PLAN:   CHRONIC VENOUS INSUFFICIENCY: This patient has CEAP C2 venous disease.  He does have deep venous reflux on the right but really no significant superficial venous reflux.  We have discussed the importance of intermittent leg elevation the proper positioning for this.  In addition I have encouraged him to wear knee-high compression stockings with a gradient of 15 to 20 mmHg which he thinks he has.  I have encouraged him to avoid prolonged sitting and standing.  Given that he uses a standing desk we have discussed possibly using a treadmill desk.  We also discussed importance of exercise specifically walking and water aerobics.  I explained that if his venous symptoms progress or he develops progression of his varicose veins in the future we could reevaluate him to see if he has developed any new superficial venous reflux.  However currently he has no significant superficial venous reflux that would require laser ablation.  Deitra Mayo, MD Office: [phone]   HPI:   Mark Osborn is a pleasant 59 y.o. male, who is self-referred with varicose veins of the right lower extremity.  He has had some varicose veins bilaterally but they have been more prominent on the right side.  He has had no previous venous procedures.  He has no previous history of DVT.  He recently purchased some knee-high compression stockings.  He does not elevate his legs.  He really does not have significant aching pain or heaviness in his legs with standing.  He has no significant problems with swelling currently.  Past Medical History:  Diagnosis Date  .  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 Allergy   . Family history of BRCA2 gene positive   . Family history of breast cancer   . Personal history of colonic polyps 01/30/2014  . Tubular adenoma of colon 01/29/2014   [facility]    Family History  Problem Relation Age of Onset  . Breast cancer Mother 92       APC I1307K moderate risk mutation  . Crohn's disease Mother   . BRCA 1/2 Father        BRCA2+  . BRCA 1/2 Sister        BRCA2+  . Esophageal cancer Maternal Uncle   . Breast cancer Paternal Aunt        dx in her 69s  . Diabetes Maternal Grandmother   . Heart disease Maternal Grandmother   . Parkinson's disease Maternal Grandfather   . Breast cancer Paternal Grandmother        dx in her 29s  . Stomach cancer Paternal Grandfather   . BRCA 1/2 Sister        BRCA2 neg  . Esophageal cancer Maternal Uncle        dx in his 41s; smoker  . Breast cancer Cousin 80  . BRCA 1/2 Cousin        BRCA2+  . Colon cancer Neg Hx   . Colon polyps Neg Hx   . Rectal cancer Neg Hx     SOCIAL HISTORY: Social History   Socioeconomic History  . Marital status: Married    Spouse name: Not on file  . Number of children: 1  . Years of education: Not on file  . Highest education level: Not on file  Occupational History  . Occupation: LEARNING  AND DEVELOPMENT     Employer: [employer]  Tobacco Use  . Smoking status: Never Smoker  . Smokeless tobacco: Never Used  Vaping Use  . Vaping Use: Never used  Substance and Sexual Activity  . Alcohol use: Yes    Alcohol/week: 2.0 standard drinks    Types: 2 Cans of beer per week    Comment: beer weekly  . Drug use: No  . Sexual activity: Not on file  Other Topics Concern  . Not on file  Social History Narrative   Married 1 child   Employed by [employer]   Social Determinants of Health   Financial Resource Strain:   . Difficulty of Paying Living Expenses:   Food Insecurity:   . Worried About Charity fundraiser in the Last Year:   . Arboriculturist in the Last Year:   Transportation Needs:   . Film/video editor (Medical):   Marland Kitchen Lack of Transportation (Non-Medical):   Physical Activity:   . Days of Exercise  per Week:   . Minutes of Exercise per Session:   Stress:   . Feeling of Stress :   Social Connections:   . Frequency of Communication with Friends and Family:   . Frequency of Social Gatherings with Friends and Family:   . Attends Religious Services:   . Active Member of Clubs or Organizations:   . Attends Archivist Meetings:   Marland Kitchen Marital Status:   Intimate Partner Violence:   . Fear of Current or Ex-Partner:   . Emotionally Abused:   Marland Kitchen Physically Abused:   . Sexually Abused:     Allergies  Allergen Reactions  . Monosodium Glutamate Other (See Comments)    MSG=Elevate HR,fever.   . Other Other (See Comments)    Dairy =throat infection  . Penicillins Other (See Comments)    As a child    Current Outpatient Medications  Medication Sig Dispense Refill  . cetirizine (ZYRTEC) 10 MG tablet Take 10 mg by mouth daily.    . Melatonin 10-10 MG TBCR Take by mouth.    . zolpidem (AMBIEN CR) 12.5 MG CR tablet Take 12.5 mg by mouth Nightly.    . fluticasone (FLONASE) 50 MCG/ACT nasal spray Place 2 sprays into both nostrils daily for 10 days. 16 g 0  . montelukast (SINGULAIR) 10 MG tablet Take 1 tablet (10 mg total) by mouth at bedtime. (Patient not taking: Reported on 10/17/2019) 30 tablet 1  . zolpidem (AMBIEN) 10 MG tablet Take 5 mg by mouth at bedtime as needed for sleep. (Patient not taking: Reported on 04/10/2020)     No current facility-administered medications for this visit.    REVIEW OF SYSTEMS:  '[X]'  denotes positive finding, '[ ]'  denotes negative finding Cardiac  Comments:  Chest pain or chest pressure:    Shortness of breath upon exertion:    Short of breath when lying flat:    Irregular heart rhythm:        Vascular    Pain in calf, thigh, or hip brought on by ambulation:    Pain in feet at night that wakes you up from your sleep:     Blood clot in your veins:    Leg swelling:         Pulmonary    Oxygen at home:    Productive cough:     Wheezing:          Neurologic    Sudden weakness  in arms or legs:     Sudden numbness in arms or legs:     Sudden onset of difficulty speaking or slurred speech:    Temporary loss of vision in one eye:     Problems with dizziness:         Gastrointestinal    Blood in stool:     Vomited blood:         Genitourinary    Burning when urinating:     Blood in urine:        Psychiatric    Major depression:         Hematologic    Bleeding problems:    Problems with blood clotting too easily:        Skin    Rashes or ulcers:        Constitutional    Fever or chills:     PHYSICAL EXAM:   Vitals:   04/10/20 1255  BP: 100/67  Pulse: 66  Resp: 18  Temp: 98 F (36.7 C)  TempSrc: Temporal  SpO2: 97%  Weight: 132 lb 1.6 oz (59.9 kg)  Height: 5' 5.5" (1.664 m)    GENERAL: The patient is a well-nourished male, in no acute distress. The vital signs are documented above. CARDIAC: There is a regular rate and rhythm.  VASCULAR: I do not detect carotid bruits. He has palpable pedal pulses bilaterally. He has no significant lower extremity swelling. He has no hyperpigmentation. He has some dilated varicose veins in his right leg below the knee as documented below.    PULMONARY: There is good air exchange bilaterally without wheezing or rales. ABDOMEN: Soft and non-tender with normal pitched bowel sounds.  MUSCULOSKELETAL: There are no major deformities or cyanosis. NEUROLOGIC: No focal weakness or paresthesias are detected. SKIN: There are no ulcers or rashes noted. PSYCHIATRIC: The patient has a normal affect.  DATA:    VENOUS DUPLEX: I have independently interpreted his venous duplex of the right lower extremity.  There is no evidence of DVT.  He has deep venous reflux involving the common femoral vein, femoral vein, and popliteal vein.  He has reflux at the saphenofemoral junction.  He has a short segment of reflux in the superficial system in the great saphenous vein in the calf.  The vein  here is not especially dilated.

## 2020-04-21 MED FILL — ZOLPIDEM TART ER 12.5 MG TA: 12.5 | 30 days supply | Qty: 30 | Fill #2

## 2020-04-29 DIAGNOSIS — E7800 Pure hypercholesterolemia, unspecified: Secondary | ICD-10-CM | POA: Insufficient documentation

## 2020-04-29 DIAGNOSIS — J302 Other seasonal allergic rhinitis: Secondary | ICD-10-CM | POA: Diagnosis not present

## 2020-04-29 DIAGNOSIS — R351 Nocturia: Secondary | ICD-10-CM | POA: Diagnosis not present

## 2020-04-29 DIAGNOSIS — R3911 Hesitancy of micturition: Secondary | ICD-10-CM | POA: Diagnosis not present

## 2020-04-29 DIAGNOSIS — Z86010 Personal history of colonic polyps: Secondary | ICD-10-CM | POA: Diagnosis not present

## 2020-04-29 DIAGNOSIS — Z0000 Encounter for general adult medical examination without abnormal findings: Secondary | ICD-10-CM | POA: Diagnosis not present

## 2020-04-29 DIAGNOSIS — F5101 Primary insomnia: Secondary | ICD-10-CM | POA: Diagnosis not present

## 2020-05-06 DIAGNOSIS — F4323 Adjustment disorder with mixed anxiety and depressed mood: Secondary | ICD-10-CM | POA: Diagnosis not present

## 2020-05-16 ENCOUNTER — Ambulatory Visit (INDEPENDENT_AMBULATORY_CARE_PROVIDER_SITE_OTHER): Payer: 59 | Admitting: Podiatry

## 2020-05-16 ENCOUNTER — Other Ambulatory Visit: Payer: Self-pay

## 2020-05-16 ENCOUNTER — Ambulatory Visit (INDEPENDENT_AMBULATORY_CARE_PROVIDER_SITE_OTHER): Payer: 59

## 2020-05-16 ENCOUNTER — Encounter: Payer: Self-pay | Admitting: Podiatry

## 2020-05-16 VITALS — BP 103/71 | HR 70 | Temp 97.7°F | Resp 16

## 2020-05-16 DIAGNOSIS — M21619 Bunion of unspecified foot: Secondary | ICD-10-CM

## 2020-05-16 DIAGNOSIS — M779 Enthesopathy, unspecified: Secondary | ICD-10-CM | POA: Diagnosis not present

## 2020-05-16 DIAGNOSIS — M19072 Primary osteoarthritis, left ankle and foot: Secondary | ICD-10-CM | POA: Diagnosis not present

## 2020-05-16 NOTE — Progress Notes (Signed)
Subjective:   Patient ID: Mark Osborn, male   DOB: 59 y.o.   MRN: [id number]   HPI 59 year old male presents the office today for concerns of a bunion to the left foot which he just started noticing last week.  He is not seeing significant increased swelling or any redness to the area.  Some occasional discomfort but no significant pain.  Not having to take any medication for the discomfort.  He wants to have the area evaluated and discussed possible treatment options and things to look for in the future.  Review of Systems  All other systems reviewed and are negative.  Past Medical History:  Diagnosis Date  .  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 Allergy   . Family history of BRCA2 gene positive   . Family history of breast cancer   . Personal history of colonic polyps 01/30/2014  . Tubular adenoma of colon 01/29/2014   [facility]    Past Surgical History:  Procedure Laterality Date  . COLONOSCOPY    . COLONOSCOPY W/ BIOPSIES    . HERNIA REPAIR  1989  . MOUTH SURGERY    . TONSILLECTOMY       Current Outpatient Medications:  .  cetirizine (ZYRTEC) 10 MG tablet, Take 10 mg by mouth daily., Disp: , Rfl:  .  Melatonin 10-10 MG TBCR, Take by mouth., Disp: , Rfl:  .  zolpidem (AMBIEN CR) 12.5 MG CR tablet, Take 12.5 mg by mouth Nightly., Disp: , Rfl:   Allergies  Allergen Reactions  . Monosodium Glutamate Other (See Comments)    MSG=Elevate HR,fever.   . Other Other (See Comments)    Dairy =throat infection  . Penicillins Other (See Comments)    As a child         Objective:  Physical Exam  General: AAO x3, NAD  Dermatological: There is minimal erythema to the medial first metatarsal heads bilaterally in the area of bunions but there is no skin breakdown, warmth.  There are no open lesions identified at this time.  Vascular: Dorsalis Pedis artery and Posterior Tibial artery pedal pulses are 2/4 bilateral with immedate  capillary fill time.  There is no pain with calf compression, swelling, warmth, erythema.  Varicose veins are present bilaterally.  Neruologic: Grossly intact via light touch bilateral.   Musculoskeletal: Moderate bunions are present bilaterally.  There is have more of a spur present on the medial first MPJ on the left side compared to right.  There is no pain or crepitation with MPJ range of motion there is no first ray hypermobility present.  No significant tenderness palpation on exam today.  Muscular strength 5/5 in all groups tested bilateral.  Gait: Unassisted, Nonantalgic.       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  I independently reviewed them with the patient.  Bunion deformities present mild arthritic changes present the first MPJ. -We discussed treatment options.  This point recommended supportive shoes, inserts.  We can use Voltaren gel as needed.  If symptoms worsen consider steroid injection, orthotics before proceeding with surgery but currently minimal discomfort and will continue treat this conservatively  Trula Slade DPM

## 2020-05-16 NOTE — Patient Instructions (Signed)
Bunion  A bunion is a bump on the base of the big toe that forms when the bones of the big toe joint move out of position. Bunions may be small at first, but they often get larger over time. They can make walking painful. What are the causes? A bunion may be caused by:  Wearing narrow or pointed shoes that force the big toe to press against the other toes.  Abnormal foot development that causes the foot to roll inward (pronate).  Changes in the foot that are caused by certain diseases, such as rheumatoid arthritis or polio.  A foot injury. What increases the risk? The following factors may make you more likely to develop this condition:  Wearing shoes that squeeze the toes together.  Having certain diseases, such as: ? Rheumatoid arthritis. ? Polio. ? Cerebral palsy.  Having family members who have bunions.  Being born with a foot deformity, such as flat feet or low arches.  Doing activities that put a lot of pressure on the feet, such as ballet dancing. What are the signs or symptoms? The main symptom of a bunion is a noticeable bump on the big toe. Other symptoms may include:  Pain.  Swelling around the big toe.  Redness and inflammation.  Thick or hardened skin on the big toe or between the toes.  Stiffness or loss of motion in the big toe.  Trouble with walking. How is this diagnosed? A bunion may be diagnosed based on your symptoms, medical history, and activities. You may have tests, such as:  X-rays. These allow your health care provider to check the position of the bones in your foot and look for damage to your joint. They also help your health care provider determine the severity of your bunion and the best way to treat it.  Joint aspiration. In this test, a sample of fluid is removed from the toe joint. This test may be done if you are in a lot of pain. It helps rule out diseases that cause painful swelling of the joints, such as arthritis. How is this  treated? Treatment depends on the severity of your symptoms. The goal of treatment is to relieve symptoms and prevent the bunion from getting worse. Your health care provider may recommend:  Wearing shoes that have a wide toe box.  Using bunion pads to cushion the affected area.  Taping your toes together to keep them in a normal position.  Placing a device inside your shoe (orthotics) to help reduce pressure on your toe joint.  Taking medicine to ease pain, inflammation, and swelling.  Applying heat or ice to the affected area.  Doing stretching exercises.  Surgery to remove scar tissue and move the toes back into their normal position. This treatment is rare. Follow these instructions at home: Managing pain, stiffness, and swelling   If directed, put ice on the painful area: ? Put ice in a plastic bag. ? Place a towel between your skin and the bag. ? Leave the ice on for 20 minutes, 2-3 times a day. Activity   If directed, apply heat to the affected area before you exercise. Use the heat source that your health care provider recommends, such as a moist heat pack or a heating pad. ? Place a towel between your skin and the heat source. ? Leave the heat on for 20-30 minutes. ? Remove the heat if your skin turns bright red. This is especially important if you are unable to feel pain,   heat, or cold. You may have a greater risk of getting burned.  Do exercises as told by your health care provider. General instructions  Support your toe joint with proper footwear, shoe padding, or taping as told by your health care provider.  Take over-the-counter and prescription medicines only as told by your health care provider.  Keep all follow-up visits as told by your health care provider. This is important. Contact a health care provider if your symptoms:  Get worse.  Do not improve in 2 weeks. Get help right away if you have:  Severe pain and trouble with walking. Summary  A  bunion is a bump on the base of the big toe that forms when the bones of the big toe joint move out of position.  Bunions can make walking painful.  Treatment depends on the severity of your symptoms.  Support your toe joint with proper footwear, shoe padding, or taping as told by your health care provider. This information is not intended to replace advice given to you by your health care provider. Make sure you discuss any questions you have with your health care provider. Document Revised: 04/17/2018 Document Reviewed: 02/21/2018 Elsevier Patient Education  2020 Elsevier Inc.  

## 2020-05-20 DIAGNOSIS — F4323 Adjustment disorder with mixed anxiety and depressed mood: Secondary | ICD-10-CM | POA: Diagnosis not present

## 2020-05-20 MED FILL — ZOLPIDEM TART ER 12.5 MG TA: 12.5 | 30 days supply | Qty: 30 | Fill #0

## 2020-06-09 DIAGNOSIS — F4323 Adjustment disorder with mixed anxiety and depressed mood: Secondary | ICD-10-CM | POA: Diagnosis not present

## 2020-06-23 MED FILL — ZOLPIDEM TART ER 12.5 MG TA: 12.5 | 30 days supply | Qty: 30 | Fill #0

## 2020-06-26 DIAGNOSIS — F4323 Adjustment disorder with mixed anxiety and depressed mood: Secondary | ICD-10-CM | POA: Diagnosis not present

## 2020-07-07 DIAGNOSIS — J301 Allergic rhinitis due to pollen: Secondary | ICD-10-CM | POA: Diagnosis not present

## 2020-07-07 DIAGNOSIS — J029 Acute pharyngitis, unspecified: Secondary | ICD-10-CM | POA: Diagnosis not present

## 2020-07-14 DIAGNOSIS — F4323 Adjustment disorder with mixed anxiety and depressed mood: Secondary | ICD-10-CM | POA: Diagnosis not present

## 2020-07-23 MED FILL — ZOLPIDEM TART ER 12.5 MG TA: 12.5 | 30 days supply | Qty: 30 | Fill #1

## 2020-07-29 DIAGNOSIS — F4323 Adjustment disorder with mixed anxiety and depressed mood: Secondary | ICD-10-CM | POA: Diagnosis not present

## 2020-08-18 DIAGNOSIS — F4323 Adjustment disorder with mixed anxiety and depressed mood: Secondary | ICD-10-CM | POA: Diagnosis not present

## 2020-08-22 MED FILL — ZOLPIDEM TART ER 12.5 MG TA: 12.5 | 30 days supply | Qty: 30 | Fill #2

## 2020-08-26 DIAGNOSIS — H524 Presbyopia: Secondary | ICD-10-CM | POA: Diagnosis not present

## 2020-09-08 DIAGNOSIS — H00021 Hordeolum internum right upper eyelid: Secondary | ICD-10-CM | POA: Diagnosis not present

## 2020-09-09 DIAGNOSIS — F320 Major depressive disorder, single episode, mild: Secondary | ICD-10-CM | POA: Diagnosis not present

## 2020-09-09 DIAGNOSIS — F5101 Primary insomnia: Secondary | ICD-10-CM | POA: Diagnosis not present

## 2020-09-11 DIAGNOSIS — F4323 Adjustment disorder with mixed anxiety and depressed mood: Secondary | ICD-10-CM | POA: Diagnosis not present

## 2020-09-13 DIAGNOSIS — F4323 Adjustment disorder with mixed anxiety and depressed mood: Secondary | ICD-10-CM | POA: Diagnosis not present

## 2020-09-15 ENCOUNTER — Other Ambulatory Visit (HOSPITAL_COMMUNITY): Payer: Self-pay | Admitting: Family Medicine

## 2020-09-15 MED FILL — ZOLPIDEM TART ER 12.5 MG TA: 12.5 | 30 days supply | Qty: 30 | Fill #3

## 2020-09-15 MED FILL — ESCITALOPRAM 10 MG TABLET: 10 | 33 days supply | Qty: 30 | Fill #0

## 2020-10-08 DIAGNOSIS — F4323 Adjustment disorder with mixed anxiety and depressed mood: Secondary | ICD-10-CM | POA: Diagnosis not present

## 2020-10-15 MED FILL — ESCITALOPRAM 10 MG TABLET: 10 | 33 days supply | Qty: 30 | Fill #1

## 2020-10-20 ENCOUNTER — Other Ambulatory Visit (HOSPITAL_COMMUNITY): Payer: Self-pay | Admitting: Family Medicine

## 2020-10-20 MED FILL — ZOLPIDEM TART ER 12.5 MG TA: 12.5 | 30 days supply | Qty: 30 | Fill #0

## 2020-10-25 DIAGNOSIS — Z20828 Contact with and (suspected) exposure to other viral communicable diseases: Secondary | ICD-10-CM | POA: Diagnosis not present

## 2020-10-31 DIAGNOSIS — Z20822 Contact with and (suspected) exposure to covid-19: Secondary | ICD-10-CM | POA: Diagnosis not present

## 2020-10-31 DIAGNOSIS — R0981 Nasal congestion: Secondary | ICD-10-CM | POA: Diagnosis not present

## 2020-11-04 DIAGNOSIS — F4323 Adjustment disorder with mixed anxiety and depressed mood: Secondary | ICD-10-CM | POA: Diagnosis not present

## 2020-11-13 MED FILL — ESCITALOPRAM 10 MG TABLET: 10 | 33 days supply | Qty: 30 | Fill #2

## 2020-11-17 ENCOUNTER — Ambulatory Visit: Payer: 59 | Attending: Internal Medicine

## 2020-11-17 ENCOUNTER — Other Ambulatory Visit (HOSPITAL_BASED_OUTPATIENT_CLINIC_OR_DEPARTMENT_OTHER): Payer: Self-pay | Admitting: Internal Medicine

## 2020-11-17 DIAGNOSIS — Z23 Encounter for immunization: Secondary | ICD-10-CM

## 2020-11-17 MED FILL — PFIZER-BIONTECH COVID-19 VA: 30 | 21 days supply | Qty: 0 | Fill #0

## 2020-11-17 NOTE — Progress Notes (Signed)
   Covid-19 Vaccination [facility]  Name:  Mark Osborn    MRN: [id number] DOB: 1961/06/30  11/17/2020  Mr. Mark Osborn was observed post Covid-19 immunization for 15 minutes without incident. He was provided with Vaccine Information Sheet and instruction to access the V-Safe system.   Mr. Mark Osborn was instructed to call 911 with any severe reactions post vaccine: Marland Kitchen Difficulty breathing  . Swelling of face and throat  . A fast heartbeat  . A bad rash all over body  . Dizziness and weakness   Immunizations Administered    Name Date Dose VIS Date Route   Pfizer COVID-19 Vaccine 11/17/2020 11:46 AM 0.3 mL 08/13/2020 Intramuscular   Manufacturer: [place]   Lot: [id number]   NDC: [address]-4

## 2020-11-20 MED FILL — ZOLPIDEM TART ER 12.5 MG TA: 12.5 | 30 days supply | Qty: 30 | Fill #1

## 2020-11-24 IMAGING — DX DG FOOT COMPLETE 3+V*L*
3 series · 3 of 3 positions shown · non-contrast
Comparison: None.

CLINICAL DATA: Left foot pain for 1 week.  No known injury

EXAM:
LEFT FOOT - COMPLETE 3+ VIEW

[foot ap]
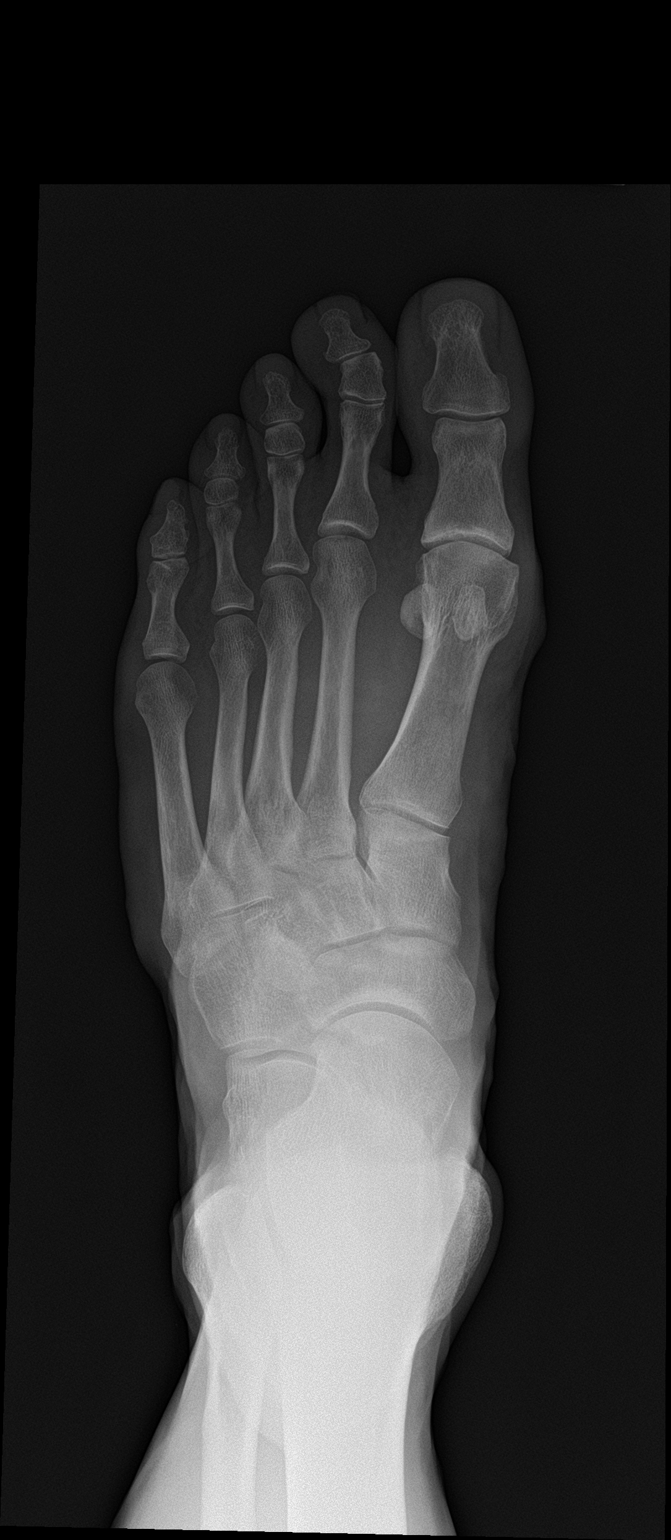

[foot obl]
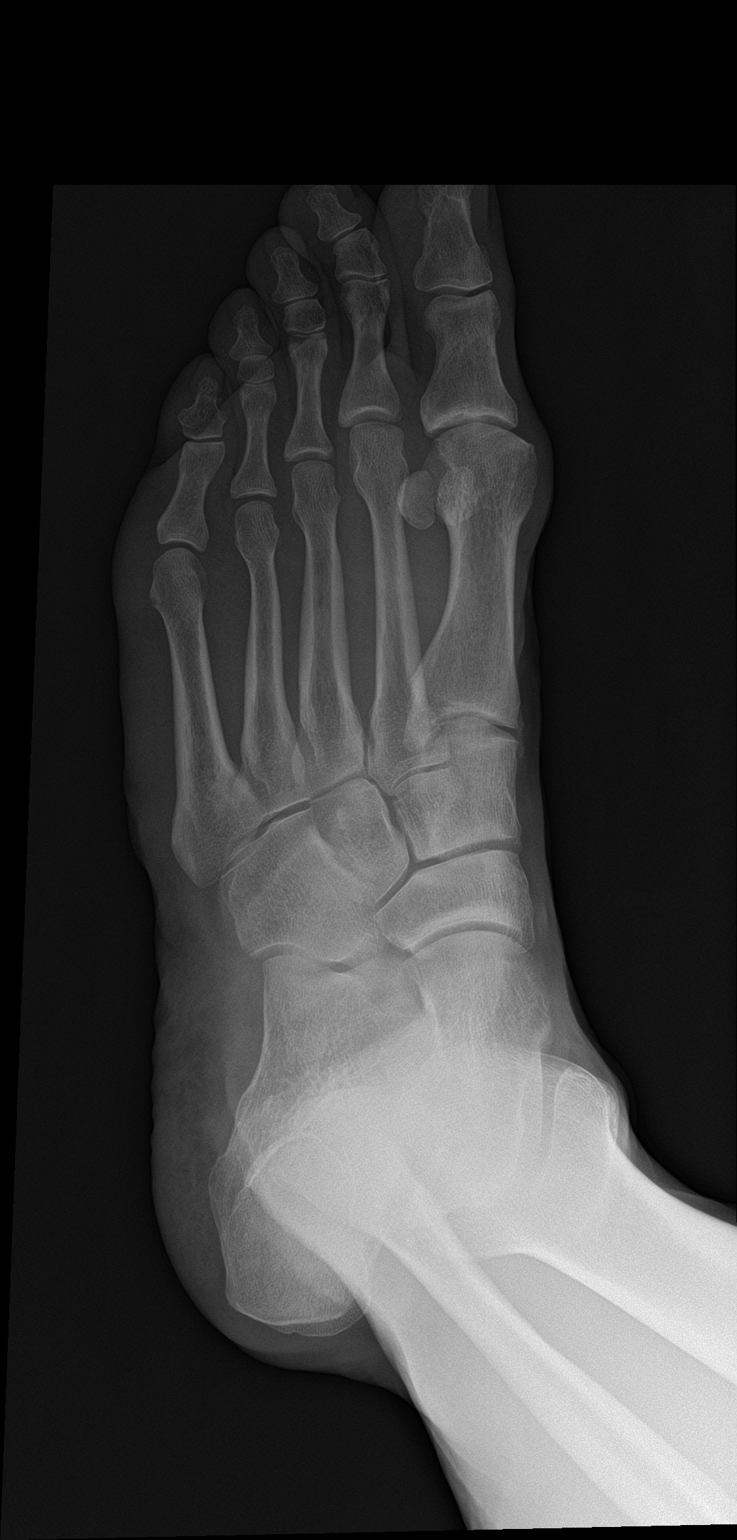

[foot lat]
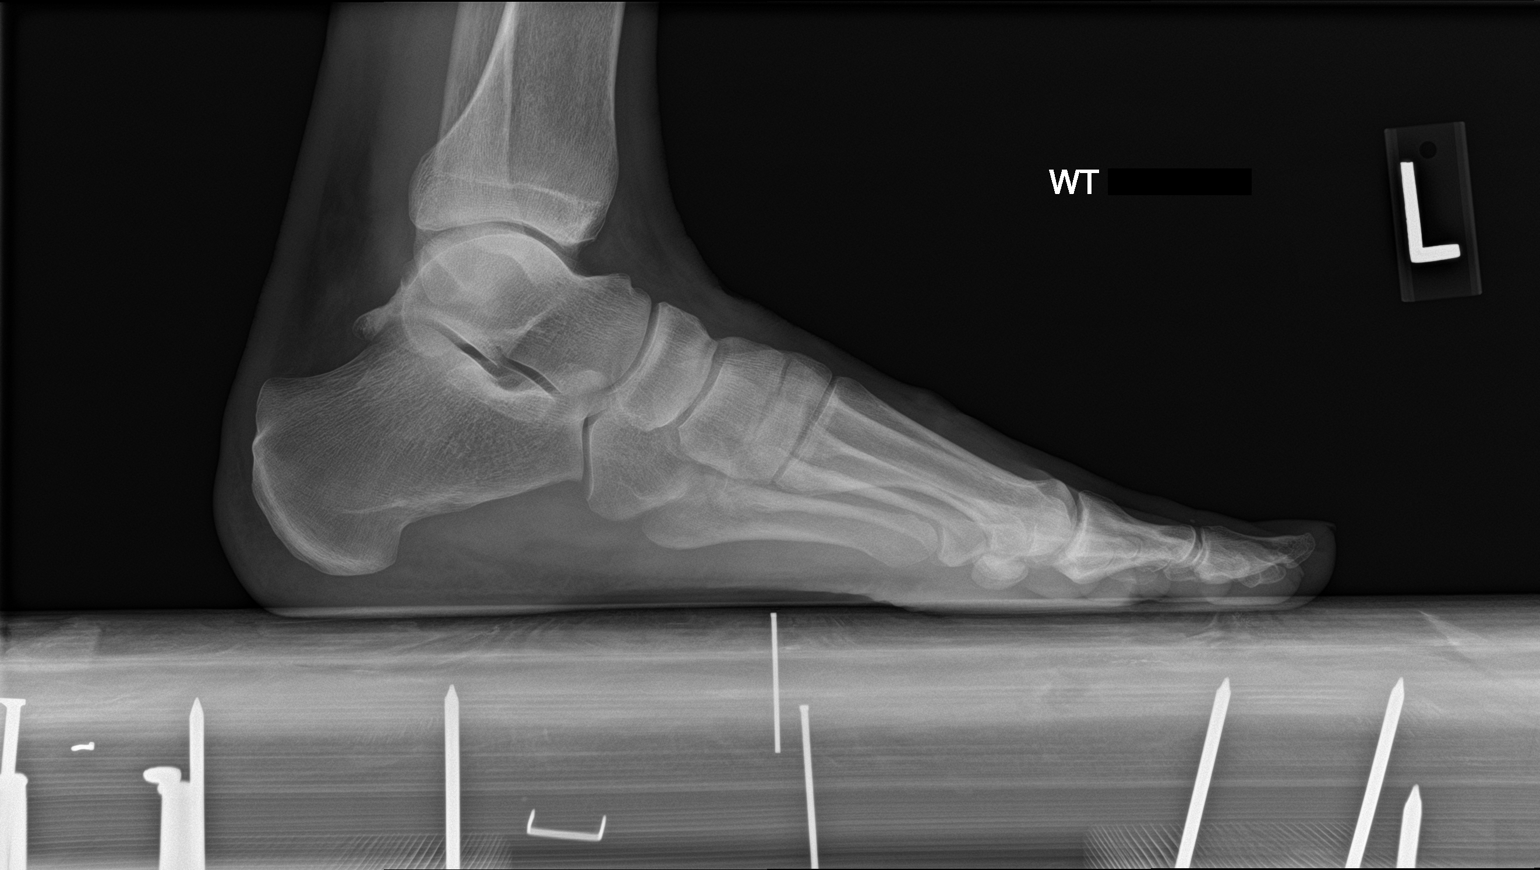

[3 of 3 positions shown; findings below may reference images not displayed]

FINDINGS: Bones: No fracture or dislocation. Normal bone mineralization. No
periostitis.

Joints: Normal alignment. No erosive changes. Mild osteoarthritis of
the first MTP joint.

Soft tissue: No soft tissue abnormality. No radiopaque foreign body.
No chondrocalcinosis.
IMPRESSION: 1. Mild osteoarthritis of the first MTP joint.
2.  No acute osseous injury of the left foot.

## 2020-11-26 DIAGNOSIS — F4323 Adjustment disorder with mixed anxiety and depressed mood: Secondary | ICD-10-CM | POA: Diagnosis not present

## 2020-12-16 DIAGNOSIS — F4323 Adjustment disorder with mixed anxiety and depressed mood: Secondary | ICD-10-CM | POA: Diagnosis not present

## 2020-12-16 MED FILL — ZOLPIDEM TART ER 12.5 MG TA: 12.5 | 30 days supply | Qty: 30 | Fill #2

## 2021-01-05 DIAGNOSIS — F4323 Adjustment disorder with mixed anxiety and depressed mood: Secondary | ICD-10-CM | POA: Diagnosis not present

## 2021-01-14 MED FILL — ZOLPIDEM TART ER 12.5 MG TA: 12.5 | 30 days supply | Qty: 30 | Fill #3

## 2021-01-14 MED FILL — ESCITALOPRAM 10 MG TABLET: 10 | 33 days supply | Qty: 30 | Fill #4

## 2021-01-30 DIAGNOSIS — L820 Inflamed seborrheic keratosis: Secondary | ICD-10-CM | POA: Diagnosis not present

## 2021-01-30 DIAGNOSIS — D485 Neoplasm of uncertain behavior of skin: Secondary | ICD-10-CM | POA: Diagnosis not present

## 2021-01-30 DIAGNOSIS — L570 Actinic keratosis: Secondary | ICD-10-CM | POA: Diagnosis not present

## 2021-02-06 DIAGNOSIS — F4323 Adjustment disorder with mixed anxiety and depressed mood: Secondary | ICD-10-CM | POA: Diagnosis not present

## 2021-02-11 ENCOUNTER — Other Ambulatory Visit (HOSPITAL_COMMUNITY): Payer: Self-pay

## 2021-02-12 ENCOUNTER — Other Ambulatory Visit (HOSPITAL_COMMUNITY): Payer: Self-pay

## 2021-02-12 MED ORDER — ESCITALOPRAM OXALATE 10 MG PO TABS
ORAL_TABLET | ORAL | 4 refills | Status: DC
  Filled 2021-02-12: qty 30, 30d supply, fill #0
  Filled 2021-03-16: qty 30, 30d supply, fill #1
  Filled 2021-04-13: qty 30, 30d supply, fill #2
  Filled 2021-05-13: qty 30, 30d supply, fill #3
  Filled 2021-06-14: qty 30, 30d supply, fill #4

## 2021-02-13 ENCOUNTER — Other Ambulatory Visit (HOSPITAL_COMMUNITY): Payer: Self-pay

## 2021-02-17 ENCOUNTER — Other Ambulatory Visit (HOSPITAL_COMMUNITY): Payer: Self-pay

## 2021-02-19 ENCOUNTER — Other Ambulatory Visit (HOSPITAL_COMMUNITY): Payer: Self-pay

## 2021-02-19 MED ORDER — ZOLPIDEM TARTRATE ER 12.5 MG PO TBCR
EXTENDED_RELEASE_TABLET | ORAL | 3 refills | Status: DC
  Filled 2021-02-19: qty 30, 30d supply, fill #0
  Filled 2021-03-17: qty 30, 30d supply, fill #1
  Filled 2021-04-17: qty 30, 30d supply, fill #2
  Filled 2021-05-18: qty 30, 30d supply, fill #3

## 2021-02-26 DIAGNOSIS — F4323 Adjustment disorder with mixed anxiety and depressed mood: Secondary | ICD-10-CM | POA: Diagnosis not present

## 2021-03-16 ENCOUNTER — Other Ambulatory Visit (HOSPITAL_COMMUNITY): Payer: Self-pay

## 2021-03-17 ENCOUNTER — Other Ambulatory Visit (HOSPITAL_COMMUNITY): Payer: Self-pay

## 2021-03-19 ENCOUNTER — Other Ambulatory Visit (HOSPITAL_COMMUNITY): Payer: Self-pay

## 2021-03-19 DIAGNOSIS — F4323 Adjustment disorder with mixed anxiety and depressed mood: Secondary | ICD-10-CM | POA: Diagnosis not present

## 2021-04-08 DIAGNOSIS — F4323 Adjustment disorder with mixed anxiety and depressed mood: Secondary | ICD-10-CM | POA: Diagnosis not present

## 2021-04-14 ENCOUNTER — Other Ambulatory Visit (HOSPITAL_COMMUNITY): Payer: Self-pay

## 2021-04-17 ENCOUNTER — Other Ambulatory Visit (HOSPITAL_COMMUNITY): Payer: Self-pay

## 2021-05-08 ENCOUNTER — Other Ambulatory Visit (HOSPITAL_BASED_OUTPATIENT_CLINIC_OR_DEPARTMENT_OTHER): Payer: Self-pay

## 2021-05-08 MED ORDER — CARESTART COVID-19 HOME TEST VI KIT
PACK | 0 refills | Status: DC
  Filled 2021-05-08: qty 2, 4d supply, fill #0

## 2021-05-12 DIAGNOSIS — E7800 Pure hypercholesterolemia, unspecified: Secondary | ICD-10-CM | POA: Diagnosis not present

## 2021-05-12 DIAGNOSIS — F320 Major depressive disorder, single episode, mild: Secondary | ICD-10-CM | POA: Diagnosis not present

## 2021-05-12 DIAGNOSIS — Z0000 Encounter for general adult medical examination without abnormal findings: Secondary | ICD-10-CM | POA: Diagnosis not present

## 2021-05-12 DIAGNOSIS — R351 Nocturia: Secondary | ICD-10-CM | POA: Diagnosis not present

## 2021-05-12 DIAGNOSIS — J302 Other seasonal allergic rhinitis: Secondary | ICD-10-CM | POA: Diagnosis not present

## 2021-05-12 DIAGNOSIS — F5101 Primary insomnia: Secondary | ICD-10-CM | POA: Diagnosis not present

## 2021-05-12 DIAGNOSIS — Z86010 Personal history of colonic polyps: Secondary | ICD-10-CM | POA: Diagnosis not present

## 2021-05-14 ENCOUNTER — Other Ambulatory Visit (HOSPITAL_COMMUNITY): Payer: Self-pay

## 2021-05-19 ENCOUNTER — Other Ambulatory Visit (HOSPITAL_COMMUNITY): Payer: Self-pay

## 2021-05-21 DIAGNOSIS — F4323 Adjustment disorder with mixed anxiety and depressed mood: Secondary | ICD-10-CM | POA: Diagnosis not present

## 2021-06-12 DIAGNOSIS — F4323 Adjustment disorder with mixed anxiety and depressed mood: Secondary | ICD-10-CM | POA: Diagnosis not present

## 2021-06-15 ENCOUNTER — Other Ambulatory Visit (HOSPITAL_COMMUNITY): Payer: Self-pay

## 2021-06-16 ENCOUNTER — Other Ambulatory Visit (HOSPITAL_COMMUNITY): Payer: Self-pay

## 2021-06-17 ENCOUNTER — Other Ambulatory Visit (HOSPITAL_COMMUNITY): Payer: Self-pay

## 2021-06-17 MED ORDER — ZOLPIDEM TARTRATE ER 12.5 MG PO TBCR
EXTENDED_RELEASE_TABLET | ORAL | 3 refills | Status: DC
  Filled 2021-06-17: qty 30, 30d supply, fill #0
  Filled 2021-07-14: qty 30, 30d supply, fill #1
  Filled 2021-08-13: qty 30, 30d supply, fill #2
  Filled 2021-09-15: qty 30, 30d supply, fill #3

## 2021-07-10 DIAGNOSIS — F4323 Adjustment disorder with mixed anxiety and depressed mood: Secondary | ICD-10-CM | POA: Diagnosis not present

## 2021-07-13 ENCOUNTER — Other Ambulatory Visit (HOSPITAL_COMMUNITY): Payer: Self-pay

## 2021-07-14 ENCOUNTER — Other Ambulatory Visit (HOSPITAL_COMMUNITY): Payer: Self-pay

## 2021-07-14 MED ORDER — ESCITALOPRAM OXALATE 10 MG PO TABS
ORAL_TABLET | ORAL | 4 refills | Status: DC
  Filled 2021-07-14: qty 30, 30d supply, fill #0

## 2021-07-16 ENCOUNTER — Other Ambulatory Visit (HOSPITAL_COMMUNITY): Payer: Self-pay

## 2021-07-16 MED ORDER — CARESTART COVID-19 HOME TEST VI KIT
PACK | 0 refills | Status: DC
  Filled 2021-07-16: qty 4, 4d supply, fill #0

## 2021-07-27 DIAGNOSIS — F4323 Adjustment disorder with mixed anxiety and depressed mood: Secondary | ICD-10-CM | POA: Diagnosis not present

## 2021-08-06 ENCOUNTER — Other Ambulatory Visit (HOSPITAL_COMMUNITY): Payer: Self-pay

## 2021-08-06 DIAGNOSIS — S39011A Strain of muscle, fascia and tendon of abdomen, initial encounter: Secondary | ICD-10-CM | POA: Diagnosis not present

## 2021-08-06 MED ORDER — ESCITALOPRAM OXALATE 10 MG PO TABS
ORAL_TABLET | ORAL | 3 refills | Status: DC
  Filled 2021-08-06: qty 90, 90d supply, fill #0
  Filled 2021-11-13: qty 90, 90d supply, fill #1
  Filled 2022-02-08: qty 90, 90d supply, fill #2
  Filled 2022-05-05: qty 90, 90d supply, fill #3

## 2021-08-08 ENCOUNTER — Other Ambulatory Visit (HOSPITAL_COMMUNITY): Payer: Self-pay

## 2021-08-14 ENCOUNTER — Other Ambulatory Visit (HOSPITAL_COMMUNITY): Payer: Self-pay

## 2021-08-17 DIAGNOSIS — F4323 Adjustment disorder with mixed anxiety and depressed mood: Secondary | ICD-10-CM | POA: Diagnosis not present

## 2021-08-31 DIAGNOSIS — F4323 Adjustment disorder with mixed anxiety and depressed mood: Secondary | ICD-10-CM | POA: Diagnosis not present

## 2021-09-14 DIAGNOSIS — F4323 Adjustment disorder with mixed anxiety and depressed mood: Secondary | ICD-10-CM | POA: Diagnosis not present

## 2021-09-16 ENCOUNTER — Other Ambulatory Visit (HOSPITAL_COMMUNITY): Payer: Self-pay

## 2021-09-21 DIAGNOSIS — F4323 Adjustment disorder with mixed anxiety and depressed mood: Secondary | ICD-10-CM | POA: Diagnosis not present

## 2021-10-14 ENCOUNTER — Other Ambulatory Visit (HOSPITAL_COMMUNITY): Payer: Self-pay

## 2021-10-14 DIAGNOSIS — H52223 Regular astigmatism, bilateral: Secondary | ICD-10-CM | POA: Diagnosis not present

## 2021-10-15 ENCOUNTER — Other Ambulatory Visit (HOSPITAL_COMMUNITY): Payer: Self-pay

## 2021-10-15 MED ORDER — ZOLPIDEM TARTRATE ER 12.5 MG PO TBCR
12.5000 mg | EXTENDED_RELEASE_TABLET | Freq: Every evening | ORAL | 3 refills | Status: DC | PRN
  Filled 2021-10-15: qty 30, 30d supply, fill #0
  Filled 2021-11-12: qty 30, 30d supply, fill #1
  Filled 2021-12-14: qty 30, 30d supply, fill #2
  Filled 2022-01-14: qty 30, 30d supply, fill #3

## 2021-11-13 ENCOUNTER — Other Ambulatory Visit (HOSPITAL_COMMUNITY): Payer: Self-pay

## 2021-11-13 DIAGNOSIS — F32A Depression, unspecified: Secondary | ICD-10-CM | POA: Diagnosis not present

## 2021-11-13 DIAGNOSIS — E7800 Pure hypercholesterolemia, unspecified: Secondary | ICD-10-CM | POA: Diagnosis not present

## 2021-11-13 DIAGNOSIS — F5101 Primary insomnia: Secondary | ICD-10-CM | POA: Diagnosis not present

## 2021-11-13 DIAGNOSIS — J302 Other seasonal allergic rhinitis: Secondary | ICD-10-CM | POA: Diagnosis not present

## 2021-12-15 ENCOUNTER — Other Ambulatory Visit (HOSPITAL_COMMUNITY): Payer: Self-pay

## 2021-12-30 DIAGNOSIS — F4323 Adjustment disorder with mixed anxiety and depressed mood: Secondary | ICD-10-CM | POA: Diagnosis not present

## 2022-01-14 DIAGNOSIS — F4323 Adjustment disorder with mixed anxiety and depressed mood: Secondary | ICD-10-CM | POA: Diagnosis not present

## 2022-01-15 ENCOUNTER — Other Ambulatory Visit (HOSPITAL_COMMUNITY): Payer: Self-pay

## 2022-01-28 DIAGNOSIS — F4323 Adjustment disorder with mixed anxiety and depressed mood: Secondary | ICD-10-CM | POA: Diagnosis not present

## 2022-02-09 ENCOUNTER — Other Ambulatory Visit (HOSPITAL_COMMUNITY): Payer: Self-pay

## 2022-02-14 ENCOUNTER — Other Ambulatory Visit (HOSPITAL_COMMUNITY): Payer: Self-pay

## 2022-02-15 ENCOUNTER — Other Ambulatory Visit (HOSPITAL_COMMUNITY): Payer: Self-pay

## 2022-02-16 ENCOUNTER — Other Ambulatory Visit (HOSPITAL_COMMUNITY): Payer: Self-pay

## 2022-02-16 DIAGNOSIS — F4323 Adjustment disorder with mixed anxiety and depressed mood: Secondary | ICD-10-CM | POA: Diagnosis not present

## 2022-02-16 MED ORDER — ZOLPIDEM TARTRATE ER 12.5 MG PO TBCR
12.5000 mg | EXTENDED_RELEASE_TABLET | Freq: Every evening | ORAL | 3 refills | Status: DC | PRN
  Filled 2022-02-16: qty 30, 30d supply, fill #0
  Filled 2022-03-15: qty 30, 30d supply, fill #1
  Filled 2022-04-13: qty 30, 30d supply, fill #2
  Filled 2022-05-12: qty 30, 30d supply, fill #3

## 2022-03-08 DIAGNOSIS — F4323 Adjustment disorder with mixed anxiety and depressed mood: Secondary | ICD-10-CM | POA: Diagnosis not present

## 2022-03-16 ENCOUNTER — Other Ambulatory Visit (HOSPITAL_COMMUNITY): Payer: Self-pay

## 2022-03-22 DIAGNOSIS — F4323 Adjustment disorder with mixed anxiety and depressed mood: Secondary | ICD-10-CM | POA: Diagnosis not present

## 2022-04-01 DIAGNOSIS — F4323 Adjustment disorder with mixed anxiety and depressed mood: Secondary | ICD-10-CM | POA: Diagnosis not present

## 2022-04-14 ENCOUNTER — Other Ambulatory Visit (HOSPITAL_COMMUNITY): Payer: Self-pay

## 2022-04-16 DIAGNOSIS — F4323 Adjustment disorder with mixed anxiety and depressed mood: Secondary | ICD-10-CM | POA: Diagnosis not present

## 2022-04-30 DIAGNOSIS — F4323 Adjustment disorder with mixed anxiety and depressed mood: Secondary | ICD-10-CM | POA: Diagnosis not present

## 2022-05-06 ENCOUNTER — Other Ambulatory Visit (HOSPITAL_COMMUNITY): Payer: Self-pay

## 2022-05-13 ENCOUNTER — Other Ambulatory Visit (HOSPITAL_COMMUNITY): Payer: Self-pay

## 2022-05-17 DIAGNOSIS — F4323 Adjustment disorder with mixed anxiety and depressed mood: Secondary | ICD-10-CM | POA: Diagnosis not present

## 2022-06-10 DIAGNOSIS — F4323 Adjustment disorder with mixed anxiety and depressed mood: Secondary | ICD-10-CM | POA: Diagnosis not present

## 2022-06-11 ENCOUNTER — Other Ambulatory Visit (HOSPITAL_COMMUNITY): Payer: Self-pay

## 2022-06-11 MED ORDER — ZOLPIDEM TARTRATE ER 12.5 MG PO TBCR
12.5000 mg | EXTENDED_RELEASE_TABLET | Freq: Every evening | ORAL | 3 refills | Status: DC | PRN
Start: 2022-06-11 — End: 2022-10-12
  Filled 2022-06-15: qty 30, 30d supply, fill #0
  Filled 2022-07-11: qty 30, 30d supply, fill #1
  Filled 2022-08-14: qty 14, 14d supply, fill #2
  Filled 2022-08-16: qty 10, 10d supply, fill #2
  Filled 2022-09-05: qty 20, 20d supply, fill #3
  Filled 2022-09-27: qty 16, 16d supply, fill #4

## 2022-06-15 ENCOUNTER — Other Ambulatory Visit (HOSPITAL_COMMUNITY): Payer: Self-pay

## 2022-06-15 ENCOUNTER — Ambulatory Visit: Payer: 59 | Admitting: Internal Medicine

## 2022-07-01 DIAGNOSIS — Z872 Personal history of diseases of the skin and subcutaneous tissue: Secondary | ICD-10-CM | POA: Diagnosis not present

## 2022-07-01 DIAGNOSIS — D229 Melanocytic nevi, unspecified: Secondary | ICD-10-CM | POA: Diagnosis not present

## 2022-07-01 DIAGNOSIS — L814 Other melanin hyperpigmentation: Secondary | ICD-10-CM | POA: Diagnosis not present

## 2022-07-01 DIAGNOSIS — Z09 Encounter for follow-up examination after completed treatment for conditions other than malignant neoplasm: Secondary | ICD-10-CM | POA: Diagnosis not present

## 2022-07-02 DIAGNOSIS — F4323 Adjustment disorder with mixed anxiety and depressed mood: Secondary | ICD-10-CM | POA: Diagnosis not present

## 2022-07-12 ENCOUNTER — Other Ambulatory Visit (HOSPITAL_COMMUNITY): Payer: Self-pay

## 2022-07-13 ENCOUNTER — Other Ambulatory Visit (HOSPITAL_COMMUNITY): Payer: Self-pay

## 2022-07-19 ENCOUNTER — Ambulatory Visit: Payer: 59 | Admitting: Internal Medicine

## 2022-07-19 ENCOUNTER — Encounter: Payer: Self-pay | Admitting: Internal Medicine

## 2022-07-19 VITALS — BP 106/62 | HR 73 | Temp 97.7°F | Resp 16 | Ht 65.5 in | Wt 141.0 lb

## 2022-07-19 DIAGNOSIS — Z125 Encounter for screening for malignant neoplasm of prostate: Secondary | ICD-10-CM | POA: Diagnosis not present

## 2022-07-19 DIAGNOSIS — Z0000 Encounter for general adult medical examination without abnormal findings: Secondary | ICD-10-CM | POA: Diagnosis not present

## 2022-07-19 DIAGNOSIS — Z23 Encounter for immunization: Secondary | ICD-10-CM

## 2022-07-19 LAB — LIPID PANEL
Cholesterol: 197 mg/dL (ref 0–200)
HDL: 47.7 mg/dL (ref >39.00)
LDL Cholesterol: 119 mg/dL — ABNORMAL HIGH (ref 0–99)
NonHDL: 149.16
Total CHOL/HDL Ratio: 4
Triglycerides: 151 mg/dL — ABNORMAL HIGH (ref 0.0–149.0)
VLDL: 30.2 mg/dL (ref 0.0–40.0)

## 2022-07-19 LAB — PSA: PSA: 0.58 ng/mL (ref 0.10–4.00)

## 2022-07-19 NOTE — Progress Notes (Signed)
Subjective:  Patient ID: Mark Osborn, male    DOB: 05-Jul-1961  Age: 61 y.o. MRN: [id number]  CC: Annual Exam   HPI Mark Osborn presents for a CPX and to establish -  Mark Osborn exercises on an elliptical about 20 minutes a day and does not experience chest pain, shortness of breath, diaphoresis, edema, or fatigue.  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   Mark Osborn has a past surgical history that includes Colonoscopy w/ biopsies; Hernia repair (1989); Mouth surgery; Tonsillectomy; and Colonoscopy.   His family history includes BRCA 1/2 in his cousin, father, sister, and sister; Breast cancer in his paternal aunt and paternal grandmother; Breast cancer (age of onset: 51) in his cousin; Breast cancer (age of onset: 43) in his mother; Crohn's disease in his mother; Diabetes in his maternal grandmother; Esophageal cancer in his maternal uncle and maternal uncle; Heart disease in his maternal grandmother; Parkinson's disease in his maternal grandfather; Stomach cancer in his paternal grandfather.Mark Osborn reports that Mark Osborn has never smoked. Mark Osborn has never been exposed to tobacco smoke. Mark Osborn has never used smokeless tobacco. Mark Osborn reports current alcohol use of about 2.0 standard drinks of alcohol per week. Mark Osborn reports that Mark Osborn does not use drugs.  Outpatient Medications Prior to Visit  Medication Sig Dispense Refill   cetirizine (ZYRTEC) 10 MG tablet Take 10 mg by mouth daily.     escitalopram (LEXAPRO) 10 MG tablet Take 1/2 tablet by mouth daily for 7 days, then increase to 1 tablet daily (Patient taking differently: 10 mg.) 90 tablet 3   zolpidem (AMBIEN CR) 12.5 MG CR tablet Take 1 tablet by mouth at bedtime as needed for sleep. 30 tablet 3   COVID-19 At Home Antigen Test (CARESTART  COVID-19 HOME TEST) KIT Use as directed within package instructions 4 each 0   escitalopram (LEXAPRO) 10 MG tablet Take 1/2 tablet by mouth daily for 7 days, then take 1 tablet daily 30 tablet 4   Melatonin 10-10 MG TBCR Take by mouth.     zolpidem (AMBIEN CR) 12.5 MG CR tablet Take 12.5 mg by mouth Nightly.     escitalopram (LEXAPRO) 10 MG tablet TAKE 1/2 TABLET BY MOUTH DAILY FOR 7 DAYS, THEN TAKE ONE TABLET DAILY 30 tablet 4   zolpidem (AMBIEN CR) 12.5 MG CR tablet TAKE 1 TABLET BY MOUTH AT BEDTIME AS NEEDED FOR SLEEP 30 tablet 3   No facility-administered medications prior to visit.    ROS Review of Systems  Objective:  BP 106/62 (BP Location: Left Arm, Patient Position: Sitting, Cuff Size: Large)   Pulse 73   Temp 97.7 F (36.5 C) (Oral)   Resp 16   Ht 5' 5.5" (1.664 m)   Wt 141 lb (64 kg)   SpO2 96%   BMI 23.11 kg/m   Physical Exam Vitals reviewed.  HENT:     Nose: Nose normal.     Mouth/Throat:     Mouth: Mucous membranes are moist.  Eyes:     General: No scleral icterus.    Conjunctiva/sclera: Conjunctivae normal.  Cardiovascular:     Rate and Rhythm: Normal rate and regular rhythm.     Heart sounds: No murmur heard. Pulmonary:     Effort: Pulmonary effort is normal.     Breath sounds:  No stridor. No wheezing, rhonchi or rales.  Abdominal:     General: Abdomen is flat.     Palpations: There is no mass.     Tenderness: There is no abdominal tenderness. There is no guarding or rebound.     Hernia: No hernia is present. There is no hernia in the left inguinal area or right inguinal area.  Genitourinary:    Pubic Area: No rash.      Penis: Normal and circumcised.      Testes: Normal.     Epididymis:     Right: Normal.     Left: Normal.     Prostate: Normal. Not enlarged, not tender and no nodules present.     Rectum: Normal. Guaiac result negative. No mass, tenderness, anal fissure, external hemorrhoid or internal hemorrhoid. Normal anal tone.   Musculoskeletal:        General: Normal range of motion.     Cervical back: Neck supple.     Right lower leg: No edema.     Left lower leg: No edema.  Lymphadenopathy:     Cervical: No cervical adenopathy.     Lower Body: No right inguinal adenopathy. No left inguinal adenopathy.  Skin:    General: Skin is warm and dry.  Neurological:     General: No focal deficit present.     Mental Status: Mark Osborn is alert.  Psychiatric:        Mood and Affect: Mood normal.        Behavior: Behavior normal.     Lab Results  Component Value Date   CHOL 197 07/19/2022   TRIG 151.0 (H) 07/19/2022   HDL 47.70 07/19/2022   LDLCALC 119 (H) 07/19/2022   PSA 0.58 07/19/2022     Assessment & Plan:   Mark Osborn was seen today for annual exam.  Diagnoses and all orders for this visit:  Routine general medical examination at a health care facility- Exam completed, labs reviewed, vaccines reviewed and updated, cancer screenings are up-to-date, patient education was given. -     PSA; Future -     HIV Antibody (routine testing w rflx); Future -     Lipid panel; Future -     Lipid panel -     HIV Antibody (routine testing w rflx) -     PSA  Other orders -     Flu Vaccine QUAD 6+ mos PF IM (Fluarix Quad PF) -     Zoster Recombinant (Shingrix )   I have discontinued Mark Osborn. Mark Osborn's Melatonin and Carestart COVID-19 Home Test. I am also having Mark Osborn maintain his cetirizine, escitalopram, and zolpidem.  No orders of the defined types were placed in this encounter.    Follow-up: Return in about 1 year (around 07/20/2023).  Scarlette Calico, MD

## 2022-07-19 NOTE — Patient Instructions (Signed)
Health Maintenance, Male Adopting a healthy lifestyle and getting preventive care are important in promoting health and wellness. Ask your health care provider about: The right schedule for you to have regular tests and exams. Things you can do on your own to prevent diseases and keep yourself healthy. What should I know about diet, weight, and exercise? Eat a healthy diet  Eat a diet that includes plenty of vegetables, fruits, low-fat dairy products, and lean protein. Do not eat a lot of foods that are high in solid fats, added sugars, or sodium. Maintain a healthy weight Body mass index (BMI) is a measurement that can be used to identify possible weight problems. It estimates body fat based on height and weight. Your health care provider can help determine your BMI and help you achieve or maintain a healthy weight. Get regular exercise Get regular exercise. This is one of the most important things you can do for your health. Most adults should: Exercise for at least 150 minutes each week. The exercise should increase your heart rate and make you sweat (moderate-intensity exercise). Do strengthening exercises at least twice a week. This is in addition to the moderate-intensity exercise. Spend less time sitting. Even light physical activity can be beneficial. Watch cholesterol and blood lipids Have your blood tested for lipids and cholesterol at 59 years of age, then have this test every 5 years. You may need to have your cholesterol levels checked more often if: Your lipid or cholesterol levels are high. You are older than 59 years of age. You are at high risk for heart disease. What should I know about cancer screening? Many types of cancers can be detected early and may often be prevented. Depending on your health history and family history, you may need to have cancer screening at various ages. This may include screening for: Colorectal cancer. Prostate cancer. Skin cancer. Lung  cancer. What should I know about heart disease, diabetes, and high blood pressure? Blood pressure and heart disease High blood pressure causes heart disease and increases the risk of stroke. This is more likely to develop in people who have high blood pressure readings or are overweight. Talk with your health care provider about your target blood pressure readings. Have your blood pressure checked: Every 3-5 years if you are 18-39 years of age. Every year if you are 40 years old or older. If you are between the ages of 65 and 75 and are a current or former smoker, ask your health care provider if you should have a one-time screening for abdominal aortic aneurysm (AAA). Diabetes Have regular diabetes screenings. This checks your fasting blood sugar level. Have the screening done: Once every three years after age 45 if you are at a normal weight and have a low risk for diabetes. More often and at a younger age if you are overweight or have a high risk for diabetes. What should I know about preventing infection? Hepatitis B If you have a higher risk for hepatitis B, you should be screened for this virus. Talk with your health care provider to find out if you are at risk for hepatitis B infection. Hepatitis C Blood testing is recommended for: Everyone born from 1945 through 1965. Anyone with known risk factors for hepatitis C. Sexually transmitted infections (STIs) You should be screened each year for STIs, including gonorrhea and chlamydia, if: You are sexually active and are younger than 59 years of age. You are older than 59 years of age and your   health care provider tells you that you are at risk for this type of infection. Your sexual activity has changed since you were last screened, and you are at increased risk for chlamydia or gonorrhea. Ask your health care provider if you are at risk. Ask your health care provider about whether you are at high risk for HIV. Your health care provider  may recommend a prescription medicine to help prevent HIV infection. If you choose to take medicine to prevent HIV, you should first get tested for HIV. You should then be tested every 3 months for as long as you are taking the medicine. Follow these instructions at home: Alcohol use Do not drink alcohol if your health care provider tells you not to drink. If you drink alcohol: Limit how much you have to 0-2 drinks a day. Know how much alcohol is in your drink. In the [place] one drink equals one 12 oz bottle of beer (355 mL), one 5 oz glass of wine (148 mL), or one 1 oz glass of hard liquor (44 mL). Lifestyle Do not use any products that contain nicotine or tobacco. These products include cigarettes, chewing tobacco, and vaping devices, such as e-cigarettes. If you need help quitting, ask your health care provider. Do not use street drugs. Do not share needles. Ask your health care provider for help if you need support or information about quitting drugs. General instructions Schedule regular health, dental, and eye exams. Stay current with your vaccines. Tell your health care provider if: You often feel depressed. You have ever been abused or do not feel safe at home. Summary Adopting a healthy lifestyle and getting preventive care are important in promoting health and wellness. Follow your health care provider's instructions about healthy diet, exercising, and getting tested or screened for diseases. Follow your health care provider's instructions on monitoring your cholesterol and blood pressure. This information is not intended to replace advice given to you by your health care provider. Make sure you discuss any questions you have with your health care provider. Document Revised: 03/02/2021 Document Reviewed: 03/02/2021 Elsevier Patient Education  2023 Elsevier Inc.  

## 2022-07-20 LAB — HIV ANTIBODY (ROUTINE TESTING W REFLEX): HIV 1&2 Ab, 4th Generation: NONREACTIVE

## 2022-07-21 DIAGNOSIS — F4323 Adjustment disorder with mixed anxiety and depressed mood: Secondary | ICD-10-CM | POA: Diagnosis not present

## 2022-08-03 ENCOUNTER — Other Ambulatory Visit (HOSPITAL_COMMUNITY): Payer: Self-pay

## 2022-08-06 ENCOUNTER — Other Ambulatory Visit (HOSPITAL_COMMUNITY): Payer: Self-pay

## 2022-08-06 MED ORDER — ESCITALOPRAM OXALATE 10 MG PO TABS
10.0000 mg | ORAL_TABLET | Freq: Every day | ORAL | 3 refills | Status: DC
  Filled 2022-08-06: qty 90, 90d supply, fill #0
  Filled 2022-11-06: qty 90, 90d supply, fill #1
  Filled 2023-02-07: qty 90, 90d supply, fill #2
  Filled 2023-05-24: qty 90, 90d supply, fill #3

## 2022-08-13 DIAGNOSIS — F4323 Adjustment disorder with mixed anxiety and depressed mood: Secondary | ICD-10-CM | POA: Diagnosis not present

## 2022-08-14 ENCOUNTER — Other Ambulatory Visit (HOSPITAL_COMMUNITY): Payer: Self-pay

## 2022-08-16 ENCOUNTER — Other Ambulatory Visit (HOSPITAL_COMMUNITY): Payer: Self-pay

## 2022-09-03 DIAGNOSIS — F4323 Adjustment disorder with mixed anxiety and depressed mood: Secondary | ICD-10-CM | POA: Diagnosis not present

## 2022-09-06 ENCOUNTER — Other Ambulatory Visit (HOSPITAL_COMMUNITY): Payer: Self-pay

## 2022-09-07 ENCOUNTER — Other Ambulatory Visit (HOSPITAL_COMMUNITY): Payer: Self-pay

## 2022-09-21 ENCOUNTER — Encounter: Payer: Self-pay | Admitting: Family

## 2022-09-21 ENCOUNTER — Ambulatory Visit: Payer: 59 | Admitting: Family

## 2022-09-21 VITALS — BP 108/62 | HR 80 | Temp 97.6°F | Ht 65.5 in | Wt 137.4 lb

## 2022-09-21 DIAGNOSIS — U071 COVID-19: Secondary | ICD-10-CM | POA: Diagnosis not present

## 2022-09-21 LAB — POC COVID19 BINAXNOW: SARS Coronavirus 2 Ag: POSITIVE — AB

## 2022-09-21 NOTE — Patient Instructions (Signed)
It was very nice to see you today!   I recommend trying generic Sudafed up to 3 times per day, start with 1 time daily as can cause mild jitteriness. Do not drink extra caffeine while taking this. This will help dry up the secretions you have that can be draining into your chest aggravating your cough symptoms.  Recommend up to '25mg'$  of zinc in addition to the elderberry, echinacea and/or Vitamin C to help boost your immune system whenever symptoms first start. Stop these supplements after your symptoms resolve. Lower doses of Vitamin C are ok to take daily for immune health.  Drink plenty of fluids!      PLEASE NOTE:  If you had any lab tests please let us know if you have not heard back within a few days. You may see your results on MyChart before we have a chance to review them but we will give you a call once they are reviewed by [place]. If we ordered any referrals today, please let us know if you have not heard from their office within the next week.

## 2022-09-21 NOTE — Progress Notes (Signed)
Patient ID: Mark Osborn, male    DOB: [birth year], 61 y.o.   MRN: [id number]  Chief Complaint  Patient presents with   Cough    Pt c/o Cough with yellow mucus, sore throat(went away), body aches, Nasal congestion, chills since Friday. Has tried ibuprofen, airborne vitmain- C and fluids which did help symptoms. Flu shot in Sept. Has not been covid tested.     HPI:      Cough:    Pt c/o Cough with yellow mucus, sore throat(went away), body aches, Nasal congestion, chills since Friday. Has tried ibuprofen, airborne vitmain- C and fluids which did help symptoms. Flu shot in Sept. Has not been covid tested.   Assessment & Plan:  1. COVID-19 - sx x 4d, antiviral most likely not beneficial. Advised of CDC guidelines for isolating 5d from start of sx & masking if out in public.  Advised on using OTC generic Sudafed 2-3x/d prn, can use OTC generic Nyquil qhs for night time sx & help with sleep, continue herbal supplements while sick, then stop.  Drink plenty of fluids. Instructed to rest and hydrate well.   - POC COVID-19   Subjective:    Outpatient Medications Prior to Visit  Medication Sig Dispense Refill   cetirizine (ZYRTEC) 10 MG tablet Take 10 mg by mouth daily.     escitalopram (LEXAPRO) 10 MG tablet Take 1 tablet (10 mg total) by mouth daily. 90 tablet 3   zolpidem (AMBIEN CR) 12.5 MG CR tablet Take 1 tablet by mouth at bedtime as needed for sleep. 30 tablet 3   No facility-administered medications prior to visit.   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    Other Other (See Comments)    Dairy =throat infection   Penicillins Other (See Comments)    As a child      Objective:    Physical Exam Vitals and nursing note reviewed.  Constitutional:      General: He is not in acute distress.    Appearance: Normal appearance. He is ill-appearing.  HENT:     Head: Normocephalic.     Right Ear: Tympanic membrane and ear canal normal.     Left Ear: Tympanic membrane and ear canal normal.     Nose:     Right Sinus: No maxillary sinus tenderness or frontal sinus tenderness.     Left Sinus: No maxillary sinus tenderness or frontal sinus tenderness.     Mouth/Throat:     Mouth: Mucous membranes are moist.     Pharynx: Oropharyngeal exudate present. No pharyngeal swelling, posterior oropharyngeal erythema or uvula swelling.     Tonsils: No tonsillar exudate or tonsillar abscesses.  Cardiovascular:     Rate and Rhythm: Normal rate and regular rhythm.  Pulmonary:     Effort: Pulmonary effort is normal.     Breath sounds:  Normal breath sounds.  Musculoskeletal:        General: Normal range of motion.     Cervical back: Normal range of motion.  Lymphadenopathy:     Head:     Right side of head: No preauricular or posterior auricular adenopathy.     Left side of head: No preauricular or posterior auricular adenopathy.     Cervical: No cervical adenopathy.  Skin:    General: Skin is warm and dry.  Neurological:     Mental Status: He is alert and oriented to person, place, and time.  Psychiatric:        Mood and Affect: Mood normal.    BP 108/62 (BP Location: Left Arm, Patient Position: Sitting, Cuff Size: Large)   Pulse 80   Temp 97.6 F (36.4 C) (Temporal)   Ht 5' 5.5" (1.664 m)   Wt 137 lb 6.4 oz (62.3 kg)   SpO2 97%   BMI 22.52 kg/m  Wt Readings from Last 3 Encounters:  09/21/22 137 lb 6.4 oz (62.3 kg)  07/19/22 141 lb (64 kg)   04/10/20 132 lb 1.6 oz (59.9 kg)       Jeanie Sewer, NP

## 2022-09-27 ENCOUNTER — Other Ambulatory Visit (HOSPITAL_COMMUNITY): Payer: Self-pay

## 2022-09-28 ENCOUNTER — Other Ambulatory Visit (HOSPITAL_COMMUNITY): Payer: Self-pay

## 2022-09-30 DIAGNOSIS — F4323 Adjustment disorder with mixed anxiety and depressed mood: Secondary | ICD-10-CM | POA: Diagnosis not present

## 2022-10-12 ENCOUNTER — Other Ambulatory Visit (HOSPITAL_COMMUNITY): Payer: Self-pay

## 2022-10-12 ENCOUNTER — Other Ambulatory Visit: Payer: Self-pay | Admitting: Internal Medicine

## 2022-10-12 ENCOUNTER — Other Ambulatory Visit: Payer: Self-pay

## 2022-10-12 MED ORDER — ZOLPIDEM TARTRATE ER 12.5 MG PO TBCR
12.5000 mg | EXTENDED_RELEASE_TABLET | Freq: Every evening | ORAL | 3 refills | Status: DC | PRN
  Filled 2022-10-12: qty 30, 30d supply, fill #0
  Filled 2022-11-13 – 2022-11-15 (×2): qty 30, 30d supply, fill #1
  Filled 2022-12-12: qty 30, 30d supply, fill #2
  Filled 2023-01-11 – 2023-01-13 (×2): qty 30, 30d supply, fill #3

## 2022-10-15 DIAGNOSIS — F4323 Adjustment disorder with mixed anxiety and depressed mood: Secondary | ICD-10-CM | POA: Diagnosis not present

## 2022-10-15 DIAGNOSIS — H5203 Hypermetropia, bilateral: Secondary | ICD-10-CM | POA: Diagnosis not present

## 2022-10-19 ENCOUNTER — Encounter: Payer: Self-pay | Admitting: Internal Medicine

## 2022-10-19 ENCOUNTER — Ambulatory Visit
Admission: EM | Admit: 2022-10-19 | Discharge: 2022-10-19 | Disposition: A | Discharge status: Home / Self Care | Payer: 59

## 2022-10-19 ENCOUNTER — Other Ambulatory Visit: Payer: Self-pay | Admitting: Internal Medicine

## 2022-10-19 DIAGNOSIS — U071 COVID-19: Secondary | ICD-10-CM

## 2022-10-19 MED ORDER — NIRMATRELVIR/RITONAVIR (PAXLOVID)TABLET: 3.0000 | ORAL_TABLET | Freq: Two times a day (BID) | ORAL | 0 refills | Status: AC

## 2022-10-19 NOTE — Progress Notes (Unsigned)
Lab Results  Component Value Date   CHOL 197 07/19/2022   TRIG 151.0 (H) 07/19/2022   HDL 47.70 07/19/2022   LDLCALC 119 (H) 07/19/2022   PSA 0.58 07/19/2022

## 2022-10-29 DIAGNOSIS — F4323 Adjustment disorder with mixed anxiety and depressed mood: Secondary | ICD-10-CM | POA: Diagnosis not present

## 2022-11-02 ENCOUNTER — Ambulatory Visit (INDEPENDENT_AMBULATORY_CARE_PROVIDER_SITE_OTHER): Payer: 59 | Admitting: Internal Medicine

## 2022-11-02 ENCOUNTER — Other Ambulatory Visit (HOSPITAL_COMMUNITY): Payer: Self-pay

## 2022-11-02 ENCOUNTER — Encounter: Payer: Self-pay | Admitting: Internal Medicine

## 2022-11-02 ENCOUNTER — Ambulatory Visit (INDEPENDENT_AMBULATORY_CARE_PROVIDER_SITE_OTHER): Payer: 59

## 2022-11-02 VITALS — BP 108/82 | HR 92 | Temp 98.6°F | Resp 16 | Ht 65.5 in | Wt 137.4 lb

## 2022-11-02 DIAGNOSIS — R052 Subacute cough: Secondary | ICD-10-CM | POA: Insufficient documentation

## 2022-11-02 DIAGNOSIS — J22 Unspecified acute lower respiratory infection: Secondary | ICD-10-CM | POA: Diagnosis not present

## 2022-11-02 DIAGNOSIS — R059 Cough, unspecified: Secondary | ICD-10-CM | POA: Diagnosis not present

## 2022-11-02 MED ORDER — AZITHROMYCIN 500 MG PO TABS
500.0000 mg | ORAL_TABLET | Freq: Every day | ORAL | 0 refills | Status: AC
  Filled 2022-11-02: qty 3, 3d supply, fill #0

## 2022-11-02 MED ORDER — HYDROCODONE BIT-HOMATROP MBR 5-1.5 MG/5ML PO SOLN
5.0000 mL | Freq: Three times a day (TID) | ORAL | 0 refills | Status: AC | PRN
  Filled 2022-11-02: qty 120, 8d supply, fill #0

## 2022-11-02 NOTE — Progress Notes (Signed)
Subjective:  Patient ID: Mark Osborn, male    DOB: 1961/04/09  Age: 62 y.o. MRN: [id number]  CC: Cough   HPI Mark Osborn presents for f/up - He complains of a 62-monthhistory of cough that is intermittently productive of a yellowish phlegm.  He complains of chills but denies fever, night sweats, chest pain, or shortness of breath.  Delsym has not controlled the cough.  Outpatient Medications Prior to Visit  Medication Sig Dispense Refill   cetirizine (ZYRTEC) 10 MG tablet Take 10 mg by mouth daily.     escitalopram (LEXAPRO) 10 MG tablet Take 1 tablet (10 mg total) by mouth daily. 90 tablet 3   zolpidem (AMBIEN CR) 12.5 MG CR tablet Take 1 tablet by mouth at bedtime as needed for sleep. 30 tablet 3   No facility-administered medications prior to visit.    ROS Review of Systems  Constitutional:  Positive for chills. Negative for diaphoresis, fatigue and fever.  HENT: Negative.  Negative for sore throat.   Respiratory:  Positive for cough. Negative for chest tightness, shortness of breath and wheezing.   Cardiovascular:  Negative for chest pain, palpitations and leg swelling.  Gastrointestinal:  Negative for abdominal pain, diarrhea, nausea and vomiting.  Genitourinary: Negative.   Musculoskeletal: Negative.   Skin: Negative.  Negative for color change and rash.  Neurological:  Negative for dizziness and weakness.  Hematological:  Negative for adenopathy. Does not bruise/bleed easily.  Psychiatric/Behavioral: Negative.      Objective:  BP 108/82 (BP Location: Left Arm, Patient Position: Sitting, Cuff Size: Large)   Pulse 92   Temp 98.6 F (37 C) (Oral)   Resp 16   Ht 5' 5.5" (1.664 m)   Wt 137 lb 6.4 oz (62.3 kg)   SpO2 96%   BMI 22.52 kg/m   BP Readings from Last 3 Encounters:  11/02/22 108/82  09/21/22 108/62  07/19/22 106/62    Wt Readings from Last 3 Encounters:  11/02/22 137 lb 6.4 oz (62.3 kg)  09/21/22 137 lb 6.4 oz (62.3 kg)  07/19/22 141 lb (64 kg)     Physical Exam Vitals reviewed.  HENT:     Nose: Nose normal.     Mouth/Throat:     Mouth: Mucous membranes are moist.  Eyes:     General: No scleral icterus.    Conjunctiva/sclera: Conjunctivae normal.  Cardiovascular:     Rate and Rhythm: Regular rhythm.     Heart sounds: No murmur heard. Pulmonary:     Effort: Pulmonary effort is normal.     Breath sounds: No stridor. No wheezing, rhonchi or rales.  Abdominal:     General: Abdomen is flat.     Palpations: There is no mass.     Tenderness: There is no abdominal tenderness. There is no guarding.     Hernia: No hernia is present.  Musculoskeletal:        General: Normal range of motion.     Cervical back: Neck supple.     Right lower leg: No edema.     Left lower leg: No edema.  Skin:    General: Skin is warm and dry.  Neurological:     General: No focal deficit present.     Mental Status: He is alert.  Psychiatric:        Mood and Affect: Mood normal.        Behavior: Behavior normal.     Lab Results  Component Value Date  CHOL 197 07/19/2022   TRIG 151.0 (H) 07/19/2022   HDL 47.70 07/19/2022   LDLCALC 119 (H) 07/19/2022   PSA 0.58 07/19/2022   DG Chest 2 View  Result Date: 11/02/2022 CLINICAL DATA:  Cough. EXAM: CHEST - 2 VIEW COMPARISON:  None Available. FINDINGS: The heart size and mediastinal contours are within normal limits. Both lungs are clear. The visualized skeletal structures are unremarkable. IMPRESSION: No active cardiopulmonary disease. Electronically Signed   By: Marijo Conception M.D.   On: 11/02/2022 11:48     Assessment & Plan:   [name] was seen today for cough.  Diagnoses and all orders for this visit:  Subacute cough- His chest x-ray is negative for mass or infiltrate.  Will treat for LRTI and offer symptom relief. -     DG Chest 2 View; Future -     HYDROcodone bit-homatropine (HYCODAN) 5-1.5 MG/5ML syrup; Take 5 mLs by mouth every 8 (eight) hours as needed for up to 10 days for  cough.  LRTI (lower respiratory tract infection) -     azithromycin (ZITHROMAX) 500 MG tablet; Take 1 tablet (500 mg total) by mouth daily for 3 days. -     HYDROcodone bit-homatropine (HYCODAN) 5-1.5 MG/5ML syrup; Take 5 mLs by mouth every 8 (eight) hours as needed for up to 10 days for cough.   I am having Rozetta Nunnery start on azithromycin and HYDROcodone bit-homatropine. I am also having him maintain his cetirizine, escitalopram, and zolpidem.  Meds ordered this encounter  Medications   azithromycin (ZITHROMAX) 500 MG tablet    Sig: Take 1 tablet (500 mg total) by mouth daily for 3 days.    Dispense:  3 tablet    Refill:  0   HYDROcodone bit-homatropine (HYCODAN) 5-1.5 MG/5ML syrup    Sig: Take 5 mLs by mouth every 8 (eight) hours as needed for up to 10 days for cough.    Dispense:  120 mL    Refill:  0     Follow-up: Return if symptoms worsen or fail to improve.  Scarlette Calico, MD

## 2022-11-02 NOTE — Patient Instructions (Signed)

## 2022-11-08 ENCOUNTER — Other Ambulatory Visit (HOSPITAL_COMMUNITY): Payer: Self-pay

## 2022-11-15 ENCOUNTER — Other Ambulatory Visit: Payer: Self-pay

## 2022-11-15 ENCOUNTER — Other Ambulatory Visit (HOSPITAL_COMMUNITY): Payer: Self-pay

## 2022-11-17 DIAGNOSIS — F4323 Adjustment disorder with mixed anxiety and depressed mood: Secondary | ICD-10-CM | POA: Diagnosis not present

## 2022-12-10 DIAGNOSIS — F4323 Adjustment disorder with mixed anxiety and depressed mood: Secondary | ICD-10-CM | POA: Diagnosis not present

## 2022-12-13 ENCOUNTER — Other Ambulatory Visit: Payer: Self-pay

## 2022-12-29 DIAGNOSIS — F4323 Adjustment disorder with mixed anxiety and depressed mood: Secondary | ICD-10-CM | POA: Diagnosis not present

## 2023-01-13 ENCOUNTER — Other Ambulatory Visit (HOSPITAL_COMMUNITY): Payer: Self-pay

## 2023-01-24 DIAGNOSIS — F4323 Adjustment disorder with mixed anxiety and depressed mood: Secondary | ICD-10-CM | POA: Diagnosis not present

## 2023-02-07 ENCOUNTER — Other Ambulatory Visit (HOSPITAL_COMMUNITY): Payer: Self-pay

## 2023-02-07 ENCOUNTER — Other Ambulatory Visit: Payer: Self-pay | Admitting: Internal Medicine

## 2023-02-07 DIAGNOSIS — F5104 Psychophysiologic insomnia: Secondary | ICD-10-CM

## 2023-02-07 MED ORDER — ZOLPIDEM TARTRATE ER 12.5 MG PO TBCR
12.5000 mg | EXTENDED_RELEASE_TABLET | Freq: Every evening | ORAL | 3 refills | Status: DC | PRN
  Filled 2023-02-07 – 2023-02-10 (×3): qty 30, 30d supply, fill #0
  Filled 2023-03-13: qty 30, 30d supply, fill #1
  Filled 2023-04-11: qty 30, 30d supply, fill #2
  Filled 2023-05-09: qty 30, 30d supply, fill #3

## 2023-02-10 ENCOUNTER — Other Ambulatory Visit (HOSPITAL_COMMUNITY): Payer: Self-pay

## 2023-02-10 ENCOUNTER — Other Ambulatory Visit: Payer: Self-pay

## 2023-02-24 DIAGNOSIS — F4323 Adjustment disorder with mixed anxiety and depressed mood: Secondary | ICD-10-CM | POA: Diagnosis not present

## 2023-03-14 ENCOUNTER — Other Ambulatory Visit: Payer: Self-pay

## 2023-03-22 DIAGNOSIS — F4323 Adjustment disorder with mixed anxiety and depressed mood: Secondary | ICD-10-CM | POA: Diagnosis not present

## 2023-04-11 DIAGNOSIS — F4323 Adjustment disorder with mixed anxiety and depressed mood: Secondary | ICD-10-CM | POA: Diagnosis not present

## 2023-04-12 ENCOUNTER — Other Ambulatory Visit: Payer: Self-pay

## 2023-04-29 DIAGNOSIS — F4323 Adjustment disorder with mixed anxiety and depressed mood: Secondary | ICD-10-CM | POA: Diagnosis not present

## 2023-05-04 ENCOUNTER — Encounter: Payer: Self-pay | Admitting: Internal Medicine

## 2023-05-04 ENCOUNTER — Other Ambulatory Visit: Payer: Self-pay | Admitting: Internal Medicine

## 2023-05-09 ENCOUNTER — Other Ambulatory Visit: Payer: Self-pay

## 2023-05-19 ENCOUNTER — Encounter: Payer: Self-pay | Admitting: Internal Medicine

## 2023-05-19 DIAGNOSIS — F4323 Adjustment disorder with mixed anxiety and depressed mood: Secondary | ICD-10-CM | POA: Diagnosis not present

## 2023-05-24 ENCOUNTER — Ambulatory Visit: Payer: 59 | Admitting: Internal Medicine

## 2023-05-24 ENCOUNTER — Encounter: Payer: Self-pay | Admitting: Internal Medicine

## 2023-05-24 ENCOUNTER — Other Ambulatory Visit (HOSPITAL_COMMUNITY): Payer: Self-pay

## 2023-05-24 VITALS — BP 106/64 | HR 78 | Temp 98.2°F | Ht 65.5 in | Wt 140.0 lb

## 2023-05-24 DIAGNOSIS — N522 Drug-induced erectile dysfunction: Secondary | ICD-10-CM

## 2023-05-24 DIAGNOSIS — R739 Hyperglycemia, unspecified: Secondary | ICD-10-CM | POA: Diagnosis not present

## 2023-05-24 DIAGNOSIS — R7989 Other specified abnormal findings of blood chemistry: Secondary | ICD-10-CM

## 2023-05-24 LAB — BASIC METABOLIC PANEL
BUN: 15 mg/dL (ref 6–23)
CO2: 31 mEq/L (ref 19–32)
Calcium: 9.5 mg/dL (ref 8.4–10.5)
Chloride: 101 mEq/L (ref 96–112)
Creatinine, Ser: 1.06 mg/dL (ref 0.40–1.50)
GFR: 76.57 mL/min (ref >60.00)
Glucose, Bld: 113 mg/dL — ABNORMAL HIGH (ref 70–99)
Potassium: 4.1 mEq/L (ref 3.5–5.1)
Sodium: 138 mEq/L (ref 135–145)

## 2023-05-24 LAB — CBC WITH DIFFERENTIAL/PLATELET
Basophils Absolute: 0 10*3/uL (ref 0.0–0.1)
Basophils Relative: 0.4 % (ref 0.0–3.0)
Eosinophils Absolute: 0 10*3/uL (ref 0.0–0.7)
Eosinophils Relative: 0.3 % (ref 0.0–5.0)
HCT: 41.4 % (ref 39.0–52.0)
Hemoglobin: 13.7 g/dL (ref 13.0–17.0)
Lymphocytes Relative: 22.2 % (ref 12.0–46.0)
Lymphs Abs: 1.3 10*3/uL (ref 0.7–4.0)
MCHC: 33.1 g/dL (ref 30.0–36.0)
MCV: 92.8 fl (ref 78.0–100.0)
Monocytes Absolute: 0.4 10*3/uL (ref 0.1–1.0)
Monocytes Relative: 7.1 % (ref 3.0–12.0)
Neutro Abs: 4 10*3/uL (ref 1.4–7.7)
Neutrophils Relative %: 70 % (ref 43.0–77.0)
Platelets: 213 10*3/uL (ref 150.0–400.0)
RBC: 4.47 Mil/uL (ref 4.22–5.81)
RDW: 13.7 % (ref 11.5–15.5)
WBC: 5.7 10*3/uL (ref 4.0–10.5)

## 2023-05-24 LAB — HEPATIC FUNCTION PANEL
ALT: 54 U/L — ABNORMAL HIGH (ref 0–53)
AST: 37 U/L (ref 0–37)
Albumin: 4.5 g/dL (ref 3.5–5.2)
Alkaline Phosphatase: 79 U/L (ref 39–117)
Bilirubin, Direct: 0.1 mg/dL (ref 0.0–0.3)
Total Bilirubin: 0.5 mg/dL (ref 0.2–1.2)
Total Protein: 6.5 g/dL (ref 6.0–8.3)

## 2023-05-24 LAB — TSH: TSH: 1.48 u[IU]/mL (ref 0.35–5.50)

## 2023-05-24 MED ORDER — TADALAFIL 10 MG PO TABS
10.0000 mg | ORAL_TABLET | ORAL | 1 refills | Status: DC | PRN
  Filled 2023-05-24: qty 10, 50d supply, fill #0

## 2023-05-24 NOTE — Patient Instructions (Signed)
Tadalafil Tablets (Erectile Dysfunction, BPH) What is this medication? TADALAFIL (tah DA la fil) treats erectile dysfunction (ED). It works by increasing blood flow to the penis, which helps to maintain an erection. It may also be used to treat symptoms of an enlarged prostate (benign prostatic hyperplasia). This medicine may be used for other purposes; ask your health care provider or pharmacist if you have questions. COMMON BRAND NAME(S): Mady Gemma, Cialis What should I tell my care team before I take this medication? They need to know if you have any of these conditions: Abnormal penis shape or Peyronie disease Bleeding disorder Blood diseases, such as sickle cell anemia or leukemia Eye disease, such as retinitis pigmentosa Have had a heart attack Have had a painful and prolonged erection Have had a stroke Heart disease, such as angina, heart failure, irregular heartbeat or rhythm High or low blood pressure Stomach ulcers, other stomach or intestine problems Kidney disease Liver disease An unusual or allergic reaction to tadalafil, other medications, foods, dyes, or preservatives Pregnant or trying to get pregnant Breastfeeding How should I use this medication? Take this medication by mouth with a glass of water. Follow the directions on the prescription label. You may take this medication with or without meals. When this medication is used for erection problems, your care team may prescribe it to be taken once daily or as needed. If you are taking the medication as needed, you may be able to have sexual activity 30 minutes after taking it and for up to 36 hours after taking it. Whether you are taking the medication as needed or once daily, you should not take more than one dose per day. If you are taking this medication for symptoms of benign prostatic hyperplasia (BPH) or to treat both BPH and an erection problem, take the dose once daily at about the same time each day. Do not take  your medication more often than directed. Talk to your care team about the use of this medication in children. Special care may be needed. Overdosage: If you think you have taken too much of this medicine contact a poison control center or emergency room at once. NOTE: This medicine is only for you. Do not share this medicine with others. What if I miss a dose? If you are taking this medication as needed for erection problems, this does not apply. If you miss a dose while taking this medication once daily for an erection problem, benign prostatic hyperplasia, or both, take it as soon as you remember, but do not take more than one dose per day. What may interact with this medication? Do not take this medication with any of the following: Nitrates, such as amyl nitrite, isosorbide dinitrate, isosorbide mononitrate, nitroglycerin Other medications for erectile dysfunction, such as avanafil, sildenafil, vardenafil Other tadalafil products Riociguat Vericiguat This medication may also interact with the following: Alcohol Certain antibiotics, such as clarithromycin or erythromycin Certain antivirals for HIV or hepatitis Certain medications for blood pressure Certain medications for fungal infections, such as fluconazole, itraconazole, ketoconazole, voriconazole Certain medications for seizures, such as carbamazepine, phenytoin, phenobarbital Grapefruit juice Medications for prostate problems Rifabutin, rifampin, or rifapentine Other medications may affect the way this medication works. Talk with your care team about all of the medications you take. They may suggest changes to your treatment plan to lower the risk of side effects and to make sure your medications work as intended. This list may not describe all possible interactions. Give your health care provider a  list of all the medicines, herbs, non-prescription drugs, or dietary supplements you use. Also tell them if you smoke, drink alcohol,  or use illegal drugs. Some items may interact with your medicine. What should I watch for while using this medication? Visit your care team for regular checks on your progress. Tell your care team if your symptoms do not start to get better or if they get worse. Using this medication does not protect you or your partner against HIV or other sexually transmitted infections (STIs). Stop and call your care team right away if you have symptoms such as nausea, dizziness, or chest pain during sex. Contact your care team right away if you have an erection that lasts longer than 4 hours or if it becomes painful. This may be a sign of a serious problem and must be treated right away to prevent permanent damage. What side effects may I notice from receiving this medication? Side effects that you should report to your care team as soon as possible: Allergic reactions--skin rash, itching, hives, swelling of the face, lips, tongue, or throat Hearing loss or ringing in ears Heart attack--pain or tightness in the chest, shoulders, arms, or jaw, nausea, shortness of breath, cold or clammy skin, feeling faint or lightheaded Low blood pressure--dizziness, feeling faint or lightheaded, blurry vision Prolonged or painful erection Redness, blistering, peeling, or loosening of the skin, including inside the mouth Stroke--sudden numbness or weakness of the face, arm, or leg, trouble speaking, confusion, trouble walking, loss of balance or coordination, dizziness, severe headache, change in vision Sudden vision loss in one or both eyes Side effects that usually do not require medical attention (report to your care team if they continue or are bothersome): Back pain Facial flushing or redness Headache Muscle pain Runny or stuffy nose Upset stomach This list may not describe all possible side effects. Call your doctor for medical advice about side effects. You may report side effects to FDA at 1-800-FDA-1088. Where  should I keep my medication? Keep out of the reach of children. Store at room temperature between 15 and 30 degrees C (59 and 86 degrees F). Throw away any unused medication after the expiration date. NOTE: This sheet is a summary. It may not cover all possible information. If you have questions about this medicine, talk to your doctor, pharmacist, or health care provider.  2024 Elsevier/Gold Standard (2023-01-28 00:00:00)

## 2023-05-24 NOTE — Progress Notes (Unsigned)
   Subjective:  Patient ID: Mark Osborn, male    DOB: [birth year]  Age: 62 y.o. MRN: [id number]  CC: No chief complaint on file.   HPI Mark Osborn presents for f/up ----  Discussed the use of AI scribe software for clinical note transcription with the patient, who gave verbal consent to proceed.  History of Present Illness   The patient, who has been under the care of a counselor and therapist for several years, presents with concerns about challenges related to physical intimacy and sexual performance. He reports that these issues are not related to erectile dysfunction or libido, but rather to emotional factors and anxiety around intimacy. The patient has been taking Lexapro since the tail end of the COVID-19 pandemic to manage a period of low mood. He recently attempted to reduce the dosage from 10mg  to 5mg , but noticed feelings of anxiousness and emotional flatness, leading him to return to the original dosage. The patient does not believe the Lexapro is contributing to his intimacy issues.  The patient's counselor suggested considering a generic Cialis to assist with the intimacy issues. He is open to trying this, but is also aware that his challenges may not be primarily physiological. He has never taken any erectile dysfunction medications before and express a preference for not having more medication in his body than necessary. He is open to trying a 'weekender' approach with Cialis, taking it on a Thursday or Friday for the weekend, rather than a daily dose.  The patient denies any symptoms of an underactive thyroid gland, such as fatigue or constipation, and reports that his sleep is okay with the help of a sleep medicine. He has no family history of early heart attacks.       Outpatient Medications Prior to Visit  Medication Sig Dispense Refill   cetirizine (ZYRTEC) 10 MG tablet Take 10 mg by mouth daily.     escitalopram (LEXAPRO) 10 MG tablet Take 1 tablet (10 mg total) by mouth  daily. 90 tablet 3   zolpidem (AMBIEN CR) 12.5 MG CR tablet Take 1 tablet by mouth at bedtime as needed for sleep. 30 tablet 3   No facility-administered medications prior to visit.    ROS Review of Systems  Objective:  BP 106/64 (BP Location: Right Arm, Patient Position: Sitting, Cuff Size: Large)   Pulse 78   Temp 98.2 F (36.8 C) (Oral)   Ht 5' 5.5" (1.664 m)   Wt 140 lb (63.5 kg)   SpO2 95%   BMI 22.94 kg/m   BP Readings from Last 3 Encounters:  05/24/23 106/64  11/02/22 108/82  09/21/22 108/62    Wt Readings from Last 3 Encounters:  05/24/23 140 lb (63.5 kg)  11/02/22 137 lb 6.4 oz (62.3 kg)  09/21/22 137 lb 6.4 oz (62.3 kg)    Physical Exam  Lab Results  Component Value Date   CHOL 197 07/19/2022   TRIG 151.0 (H) 07/19/2022   HDL 47.70 07/19/2022   LDLCALC 119 (H) 07/19/2022   PSA 0.58 07/19/2022    No results found.  Assessment & Plan:  Drug-induced erectile dysfunction     Follow-up: No follow-ups on file.  Sanda Linger, MD

## 2023-05-25 DIAGNOSIS — N522 Drug-induced erectile dysfunction: Secondary | ICD-10-CM | POA: Insufficient documentation

## 2023-05-25 DIAGNOSIS — R7989 Other specified abnormal findings of blood chemistry: Secondary | ICD-10-CM | POA: Insufficient documentation

## 2023-05-25 DIAGNOSIS — R739 Hyperglycemia, unspecified: Secondary | ICD-10-CM | POA: Insufficient documentation

## 2023-06-07 DIAGNOSIS — F4323 Adjustment disorder with mixed anxiety and depressed mood: Secondary | ICD-10-CM | POA: Diagnosis not present

## 2023-06-09 ENCOUNTER — Other Ambulatory Visit (HOSPITAL_COMMUNITY): Payer: Self-pay

## 2023-06-09 ENCOUNTER — Other Ambulatory Visit: Payer: Self-pay | Admitting: Internal Medicine

## 2023-06-09 ENCOUNTER — Encounter (INDEPENDENT_AMBULATORY_CARE_PROVIDER_SITE_OTHER): Payer: Self-pay

## 2023-06-09 DIAGNOSIS — F5104 Psychophysiologic insomnia: Secondary | ICD-10-CM

## 2023-06-09 MED ORDER — ZOLPIDEM TARTRATE ER 12.5 MG PO TBCR
12.5000 mg | EXTENDED_RELEASE_TABLET | Freq: Every evening | ORAL | 1 refills | Status: DC | PRN
  Filled 2023-06-09: qty 30, 30d supply, fill #0
  Filled 2023-07-11: qty 30, 30d supply, fill #1

## 2023-06-10 ENCOUNTER — Other Ambulatory Visit (HOSPITAL_COMMUNITY): Payer: Self-pay

## 2023-06-24 DIAGNOSIS — F4323 Adjustment disorder with mixed anxiety and depressed mood: Secondary | ICD-10-CM | POA: Diagnosis not present

## 2023-07-05 DIAGNOSIS — L821 Other seborrheic keratosis: Secondary | ICD-10-CM | POA: Diagnosis not present

## 2023-07-05 DIAGNOSIS — D229 Melanocytic nevi, unspecified: Secondary | ICD-10-CM | POA: Diagnosis not present

## 2023-07-12 ENCOUNTER — Other Ambulatory Visit: Payer: Self-pay

## 2023-07-18 DIAGNOSIS — F4323 Adjustment disorder with mixed anxiety and depressed mood: Secondary | ICD-10-CM | POA: Diagnosis not present

## 2023-07-25 ENCOUNTER — Ambulatory Visit (INDEPENDENT_AMBULATORY_CARE_PROVIDER_SITE_OTHER): Payer: 59 | Admitting: Internal Medicine

## 2023-07-25 ENCOUNTER — Encounter: Payer: Self-pay | Admitting: Internal Medicine

## 2023-07-25 VITALS — BP 114/72 | HR 75 | Temp 98.3°F | Resp 16 | Ht 65.5 in | Wt 138.2 lb

## 2023-07-25 DIAGNOSIS — Z125 Encounter for screening for malignant neoplasm of prostate: Secondary | ICD-10-CM

## 2023-07-25 DIAGNOSIS — R7989 Other specified abnormal findings of blood chemistry: Secondary | ICD-10-CM

## 2023-07-25 DIAGNOSIS — E7800 Pure hypercholesterolemia, unspecified: Secondary | ICD-10-CM | POA: Diagnosis not present

## 2023-07-25 DIAGNOSIS — Z0000 Encounter for general adult medical examination without abnormal findings: Secondary | ICD-10-CM | POA: Diagnosis not present

## 2023-07-25 DIAGNOSIS — Z23 Encounter for immunization: Secondary | ICD-10-CM

## 2023-07-25 LAB — LIPID PANEL
Cholesterol: 228 mg/dL — ABNORMAL HIGH (ref 0–200)
HDL: 47.6 mg/dL (ref >39.00)
LDL Cholesterol: 153 mg/dL — ABNORMAL HIGH (ref 0–99)
NonHDL: 180.36
Total CHOL/HDL Ratio: 5
Triglycerides: 136 mg/dL (ref 0.0–149.0)
VLDL: 27.2 mg/dL (ref 0.0–40.0)

## 2023-07-25 LAB — HEPATIC FUNCTION PANEL
ALT: 39 U/L (ref 0–53)
AST: 29 U/L (ref 0–37)
Albumin: 4.5 g/dL (ref 3.5–5.2)
Alkaline Phosphatase: 70 U/L (ref 39–117)
Bilirubin, Direct: 0.1 mg/dL (ref 0.0–0.3)
Total Bilirubin: 0.8 mg/dL (ref 0.2–1.2)
Total Protein: 6.5 g/dL (ref 6.0–8.3)

## 2023-07-25 LAB — PROTIME-INR
INR: 1.2 {ratio} — ABNORMAL HIGH (ref 0.8–1.0)
Prothrombin Time: 12.4 s (ref 9.6–13.1)

## 2023-07-25 LAB — PSA: PSA: 0.73 ng/mL (ref 0.10–4.00)

## 2023-07-25 NOTE — Patient Instructions (Signed)
Health Maintenance, Male Adopting a healthy lifestyle and getting preventive care are important in promoting health and wellness. Ask your health care provider about: The right schedule for you to have regular tests and exams. Things you can do on your own to prevent diseases and keep yourself healthy. What should I know about diet, weight, and exercise? Eat a healthy diet  Eat a diet that includes plenty of vegetables, fruits, low-fat dairy products, and lean protein. Do not eat a lot of foods that are high in solid fats, added sugars, or sodium. Maintain a healthy weight Body mass index (BMI) is a measurement that can be used to identify possible weight problems. It estimates body fat based on height and weight. Your health care provider can help determine your BMI and help you achieve or maintain a healthy weight. Get regular exercise Get regular exercise. This is one of the most important things you can do for your health. Most adults should: Exercise for at least 150 minutes each week. The exercise should increase your heart rate and make you sweat (moderate-intensity exercise). Do strengthening exercises at least twice a week. This is in addition to the moderate-intensity exercise. Spend less time sitting. Even light physical activity can be beneficial. Watch cholesterol and blood lipids Have your blood tested for lipids and cholesterol at 60 years of age, then have this test every 5 years. You may need to have your cholesterol levels checked more often if: Your lipid or cholesterol levels are high. You are older than 60 years of age. You are at high risk for heart disease. What should I know about cancer screening? Many types of cancers can be detected early and may often be prevented. Depending on your health history and family history, you may need to have cancer screening at various ages. This may include screening for: Colorectal cancer. Prostate cancer. Skin cancer. Lung  cancer. What should I know about heart disease, diabetes, and high blood pressure? Blood pressure and heart disease High blood pressure causes heart disease and increases the risk of stroke. This is more likely to develop in people who have high blood pressure readings or are overweight. Talk with your health care provider about your target blood pressure readings. Have your blood pressure checked: Every 3-5 years if you are 18-39 years of age. Every year if you are 40 years old or older. If you are between the ages of 65 and 75 and are a current or former smoker, ask your health care provider if you should have a one-time screening for abdominal aortic aneurysm (AAA). Diabetes Have regular diabetes screenings. This checks your fasting blood sugar level. Have the screening done: Once every three years after age 45 if you are at a normal weight and have a low risk for diabetes. More often and at a younger age if you are overweight or have a high risk for diabetes. What should I know about preventing infection? Hepatitis B If you have a higher risk for hepatitis B, you should be screened for this virus. Talk with your health care provider to find out if you are at risk for hepatitis B infection. Hepatitis C Blood testing is recommended for: Everyone born from 1945 through 1965. Anyone with known risk factors for hepatitis C. Sexually transmitted infections (STIs) You should be screened each year for STIs, including gonorrhea and chlamydia, if: You are sexually active and are younger than 60 years of age. You are older than 60 years of age and your   health care provider tells you that you are at risk for this type of infection. Your sexual activity has changed since you were last screened, and you are at increased risk for chlamydia or gonorrhea. Ask your health care provider if you are at risk. Ask your health care provider about whether you are at high risk for HIV. Your health care provider  may recommend a prescription medicine to help prevent HIV infection. If you choose to take medicine to prevent HIV, you should first get tested for HIV. You should then be tested every 3 months for as long as you are taking the medicine. Follow these instructions at home: Alcohol use Do not drink alcohol if your health care provider tells you not to drink. If you drink alcohol: Limit how much you have to 0-2 drinks a day. Know how much alcohol is in your drink. In the [place] one drink equals one 12 oz bottle of beer (355 mL), one 5 oz glass of wine (148 mL), or one 1 oz glass of hard liquor (44 mL). Lifestyle Do not use any products that contain nicotine or tobacco. These products include cigarettes, chewing tobacco, and vaping devices, such as e-cigarettes. If you need help quitting, ask your health care provider. Do not use street drugs. Do not share needles. Ask your health care provider for help if you need support or information about quitting drugs. General instructions Schedule regular health, dental, and eye exams. Stay current with your vaccines. Tell your health care provider if: You often feel depressed. You have ever been abused or do not feel safe at home. Summary Adopting a healthy lifestyle and getting preventive care are important in promoting health and wellness. Follow your health care provider's instructions about healthy diet, exercising, and getting tested or screened for diseases. Follow your health care provider's instructions on monitoring your cholesterol and blood pressure. This information is not intended to replace advice given to you by your health care provider. Make sure you discuss any questions you have with your health care provider. Document Revised: 03/02/2021 Document Reviewed: 03/02/2021 Elsevier Patient Education  2024 [employer]  

## 2023-07-25 NOTE — Progress Notes (Unsigned)
Subjective:  Patient ID: Mark Osborn, male    DOB: [birth year]  Age: 62 y.o. MRN: [id number]  CC: Annual Exam and Hyperlipidemia   HPI Mark Osborn presents for a CPX and f/up ----  Discussed the use of AI scribe software for clinical note transcription with the patient, who gave verbal consent to proceed.  History of Present Illness   The patient, who recently turned 62, reports generally good health with no new health concerns since his last visit. He has been busy with work and travel, including a trip to [place]. He reports occasional insomnia due to having a lot on his mind, but notes that his sleep medication is effective.  The patient has not yet received his flu shot for the year and has not completed his shingles vaccination series due to contracting COVID-19. He expresses a desire to restart the shingles vaccination process.  The patient has a history of slightly elevated liver enzymes but denies any current abdominal pain. He is unsure if he has been vaccinated against hepatitis A and B.  The patient reports occasional changes in urinary flow, particularly during nighttime urination, but does not consider it a regular or significant problem. He denies any symptoms of prostate cancer or low testosterone.  The patient maintains an active lifestyle, including elliptical and weight training exercises. He reports occasional lightheadedness when standing up too quickly. He is a social drinker and non-smoker.  The patient's family history includes heart disease in his maternal grandmother, but no prostate cancer in his father or brother.       Outpatient Medications Prior to Visit  Medication Sig Dispense Refill   cetirizine (ZYRTEC) 10 MG tablet Take 10 mg by mouth daily.     escitalopram (LEXAPRO) 10 MG tablet Take 1 tablet (10 mg total) by mouth daily. 90 tablet 3   tadalafil (CIALIS) 10 MG tablet Take 1 tablet (10 mg total) by mouth every other day as needed for erectile  dysfunction. 10 tablet 1   zolpidem (AMBIEN CR) 12.5 MG CR tablet Take 1 tablet by mouth at bedtime as needed for sleep. 30 tablet 1   No facility-administered medications prior to visit.    ROS Review of Systems  Objective:  BP 114/72 (BP Location: Left Arm, Patient Position: Sitting, Cuff Size: Normal)   Pulse 75   Temp 98.3 F (36.8 C) (Oral)   Resp 16   Ht 5' 5.5" (1.664 m)   Wt 138 lb 3.2 oz (62.7 kg)   SpO2 95%   BMI 22.65 kg/m   BP Readings from Last 3 Encounters:  07/25/23 114/72  05/24/23 106/64  11/02/22 108/82    Wt Readings from Last 3 Encounters:  07/25/23 138 lb 3.2 oz (62.7 kg)  05/24/23 140 lb (63.5 kg)  11/02/22 137 lb 6.4 oz (62.3 kg)    Physical Exam Abdominal:     Hernia: There is no hernia in the left inguinal area or right inguinal area.  Genitourinary:    Pubic Area: No rash.      Penis: Normal and circumcised.      Testes: Normal.     Epididymis:     Right: Normal.     Left: Normal.     Prostate: Not enlarged, not tender and no nodules present.     Rectum: Guaiac result negative. Internal hemorrhoid present. No mass, tenderness, anal fissure or external hemorrhoid. Normal anal tone.  Lymphadenopathy:     Lower Body: No right inguinal adenopathy. No  left inguinal adenopathy.     Lab Results  Component Value Date   WBC 5.7 05/24/2023   HGB 13.7 05/24/2023   HCT 41.4 05/24/2023   PLT 213.0 05/24/2023   GLUCOSE 113 (H) 05/24/2023   CHOL 228 (H) 07/25/2023   TRIG 136.0 07/25/2023   HDL 47.60 07/25/2023   LDLCALC 153 (H) 07/25/2023   ALT 39 07/25/2023   AST 29 07/25/2023   NA 138 05/24/2023   K 4.1 05/24/2023   CL 101 05/24/2023   CREATININE 1.06 05/24/2023   BUN 15 05/24/2023   CO2 31 05/24/2023   TSH 1.48 05/24/2023   PSA 0.73 07/25/2023   INR 1.2 (H) 07/25/2023    No results found.  Assessment & Plan:  Flu vaccine need -     Flu vaccine trivalent PF, 6mos and  older(Flulaval,Afluria,Fluarix,Fluzone)  Hypercholesteremia -     Hepatic function panel; Future -     Lipid panel; Future -     Lipoprotein A (LPA); Future  Routine general medical examination at a health care facility -     PSA; Future  Elevated LFTs -     Protime-INR; Future -     Hepatic function panel; Future -     Hepatitis A antibody, total; Future -     Hepatitis C antibody; Future -     Hepatitis B surface antibody,quantitative; Future -     Hepatitis B surface antigen; Future -     Hepatitis B core antibody, total; Future  Other orders -     Varicella-zoster vaccine IM     Follow-up: Return in about 6 months (around 01/22/2024).  Sanda Linger, MD

## 2023-07-27 ENCOUNTER — Encounter: Payer: Self-pay | Admitting: Internal Medicine

## 2023-07-27 LAB — HEPATITIS B SURFACE ANTIBODY, QUANTITATIVE: Hep B S AB Quant (Post): <5 m[IU]/mL — ABNORMAL LOW (ref >=10)

## 2023-07-27 LAB — HEPATITIS A ANTIBODY, TOTAL: Hepatitis A AB,Total: NONREACTIVE

## 2023-07-27 LAB — HEPATITIS C ANTIBODY: Hepatitis C Ab: NONREACTIVE

## 2023-07-27 LAB — LIPOPROTEIN A (LPA): Lipoprotein (a): 24 nmol/L (ref <75)

## 2023-07-27 LAB — HEPATITIS B SURFACE ANTIGEN: Hepatitis B Surface Ag: NONREACTIVE

## 2023-07-27 LAB — HEPATITIS B CORE ANTIBODY, TOTAL: Hep B Core Total Ab: NONREACTIVE

## 2023-08-01 DIAGNOSIS — F4323 Adjustment disorder with mixed anxiety and depressed mood: Secondary | ICD-10-CM | POA: Diagnosis not present

## 2023-08-11 ENCOUNTER — Other Ambulatory Visit (HOSPITAL_COMMUNITY): Payer: Self-pay

## 2023-08-11 ENCOUNTER — Other Ambulatory Visit: Payer: Self-pay | Admitting: Internal Medicine

## 2023-08-11 DIAGNOSIS — F5104 Psychophysiologic insomnia: Secondary | ICD-10-CM

## 2023-08-11 MED ORDER — ZOLPIDEM TARTRATE ER 12.5 MG PO TBCR
12.5000 mg | EXTENDED_RELEASE_TABLET | Freq: Every evening | ORAL | 1 refills | Status: DC | PRN
  Filled 2023-08-11: qty 30, 30d supply, fill #0
  Filled 2023-09-07 – 2023-09-08 (×2): qty 30, 30d supply, fill #1

## 2023-08-12 ENCOUNTER — Other Ambulatory Visit (HOSPITAL_COMMUNITY): Payer: Self-pay

## 2023-08-15 ENCOUNTER — Other Ambulatory Visit (HOSPITAL_COMMUNITY): Payer: Self-pay

## 2023-08-16 ENCOUNTER — Other Ambulatory Visit (HOSPITAL_COMMUNITY): Payer: Self-pay

## 2023-08-16 MED ORDER — ESCITALOPRAM OXALATE 10 MG PO TABS
10.0000 mg | ORAL_TABLET | Freq: Every day | ORAL | 3 refills | Status: DC
  Filled 2023-08-16: qty 90, 90d supply, fill #0
  Filled 2024-02-06: qty 90, 90d supply, fill #1
  Filled 2024-05-09: qty 90, 90d supply, fill #2
  Filled 2024-08-08: qty 90, 90d supply, fill #3

## 2023-08-19 ENCOUNTER — Other Ambulatory Visit (HOSPITAL_COMMUNITY): Payer: Self-pay

## 2023-08-22 DIAGNOSIS — F4323 Adjustment disorder with mixed anxiety and depressed mood: Secondary | ICD-10-CM | POA: Diagnosis not present

## 2023-09-07 ENCOUNTER — Other Ambulatory Visit (HOSPITAL_COMMUNITY): Payer: Self-pay

## 2023-09-07 ENCOUNTER — Other Ambulatory Visit: Payer: Self-pay

## 2023-09-08 ENCOUNTER — Other Ambulatory Visit: Payer: Self-pay

## 2023-09-14 ENCOUNTER — Encounter: Payer: Self-pay | Admitting: Internal Medicine

## 2023-09-16 ENCOUNTER — Other Ambulatory Visit: Payer: Self-pay | Admitting: Internal Medicine

## 2023-09-16 ENCOUNTER — Other Ambulatory Visit (HOSPITAL_COMMUNITY): Payer: Self-pay

## 2023-09-16 DIAGNOSIS — F5104 Psychophysiologic insomnia: Secondary | ICD-10-CM | POA: Insufficient documentation

## 2023-09-16 MED ORDER — ZOLPIDEM TARTRATE ER 6.25 MG PO TBCR
6.2500 mg | EXTENDED_RELEASE_TABLET | Freq: Every evening | ORAL | 0 refills | Status: DC | PRN
Start: 2023-09-16 — End: 2024-04-02
  Filled 2023-09-16: qty 90, 90d supply, fill #0

## 2023-09-20 DIAGNOSIS — F4323 Adjustment disorder with mixed anxiety and depressed mood: Secondary | ICD-10-CM | POA: Diagnosis not present

## 2023-10-02 ENCOUNTER — Encounter: Payer: Self-pay | Admitting: Internal Medicine

## 2023-10-03 ENCOUNTER — Ambulatory Visit: Payer: 59

## 2023-10-03 ENCOUNTER — Ambulatory Visit
Admission: EM | Admit: 2023-10-03 | Discharge: 2023-10-03 | Disposition: A | Discharge status: Home / Self Care | Payer: 59 | Attending: Family Medicine | Admitting: Family Medicine

## 2023-10-03 ENCOUNTER — Other Ambulatory Visit: Payer: Self-pay | Admitting: Internal Medicine

## 2023-10-03 DIAGNOSIS — S8992XA Unspecified injury of left lower leg, initial encounter: Secondary | ICD-10-CM

## 2023-10-03 DIAGNOSIS — M25562 Pain in left knee: Secondary | ICD-10-CM | POA: Diagnosis not present

## 2023-10-03 DIAGNOSIS — M25569 Pain in unspecified knee: Secondary | ICD-10-CM | POA: Insufficient documentation

## 2023-10-03 MED ORDER — CELECOXIB 200 MG PO CAPS: 200.0000 mg | ORAL_CAPSULE | Freq: Every day | ORAL | 0 refills | Status: AC

## 2023-10-03 NOTE — ED Triage Notes (Signed)
Pt presents to [facility] with co of left knee pain starting last night. Pt reports he was attempting to get his phone he dropped last night and had sudden pain in his left knee. Pt reports he then flew home last night which he thinks worsened the pain. Pt has been using compression, ice, and tylenol and motrin for pain.

## 2023-10-03 NOTE — ED Provider Notes (Signed)
[name]    CSN: [id number] Arrival date & time: 10/03/23  4098      History   Chief Complaint Chief Complaint  Patient presents with   Knee Pain    HPI Mark Osborn is a 62 y.o. male.   HPI 62 year old male presents with knee pain that started last night.  Reports dropping his phone and pain suddenly began in the left knee.  Patient reports using RICE for left knee pain.  PMH significant for psychophysiologic insomnia, drug induced erectile dysfunction, and tubular adenoma of colon.  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   Allergy    Family history of BRCA2 gene positive    Family history of breast cancer    Personal history of colonic polyps 01/30/2014   Tubular adenoma of colon 01/29/2014   [facility]    Patient Active Problem List   Diagnosis Date Noted   Psychophysiologic insomnia 09/16/2023   Hyperglycemia 05/25/2023   Drug-induced erectile dysfunction 05/25/2023   Elevated LFTs 05/25/2023   Routine general medical examination at a health care facility 07/19/2022   Hypercholesteremia 04/29/2020   Genetic testing 03/10/2016    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 Authorizing Provider  celecoxib (CELEBREX) 200 MG capsule Take 1 capsule (200 mg total) by mouth daily for 15 days. 10/03/23 10/18/23 Yes Trevor Iha, FNP  cetirizine (ZYRTEC) 10 MG tablet Take 10 mg by mouth daily.    [provider]  escitalopram (LEXAPRO) 10 MG  tablet Take 1 tablet (10 mg total) by mouth daily. 08/16/23     tadalafil (CIALIS) 10 MG tablet Take 1 tablet (10 mg total) by mouth every other day as needed for erectile dysfunction. 05/24/23   Etta Grandchild, MD  zolpidem (AMBIEN CR) 6.25 MG CR tablet Take 1 tablet (6.25 mg total) by mouth at bedtime as needed for sleep. 09/16/23   Etta Grandchild, MD    Family History Family History  Problem Relation Age of Onset   Breast cancer Mother 38       APC I1307K moderate risk mutation   Crohn's disease Mother    BRCA 1/2 Father        BRCA2+   BRCA 1/2 Sister        BRCA2+   BRCA 1/2 Sister        BRCA2 neg   Diabetes Maternal Grandmother    Heart disease Maternal Grandmother    Parkinson's disease Maternal Grandfather    Breast cancer Paternal Grandmother        dx in her 49s   Stomach cancer Paternal Grandfather    Esophageal cancer Maternal Uncle    Esophageal cancer Maternal Uncle        dx in his 40s; smoker   Breast cancer Paternal Aunt        dx in her 78s  Breast cancer Cousin 32   BRCA 1/2 Cousin        BRCA2+   Colon cancer Neg Hx    Colon polyps Neg Hx    Rectal cancer Neg Hx     Social History Social History   Tobacco Use   Smoking status: Never    Passive exposure: Never   Smokeless tobacco: Never  Vaping Use   Vaping status: Never Used  Substance Use Topics   Alcohol use: Yes    Alcohol/week: 2.0 standard drinks of alcohol    Types: 2 Cans of beer per week    Comment: beer weekly   Drug use: No     Allergies   Monosodium glutamate, Other, and Penicillins   Review of Systems Review of Systems  Musculoskeletal:        Left knee pain x 1 day     Physical Exam Triage Vital Signs ED Triage Vitals  Encounter Vitals Group     BP 10/03/23 0831 119/82     Systolic BP Percentile --      Diastolic BP Percentile --      Pulse Rate 10/03/23 0831 71     Resp 10/03/23 0831 16     Temp 10/03/23 0831 98.1 F (36.7 C)     Temp src --      SpO2  10/03/23 0831 98 %     Weight --      Height --      Head Circumference --      Peak Flow --      Pain Score 10/03/23 0830 4     Pain Loc --      Pain Education --      Exclude from Growth Chart --    No data found.  Updated Vital Signs BP 119/82   Pulse 71   Temp 98.1 F (36.7 C)   Resp 16   SpO2 98%   Visual Acuity Right Eye Distance:   Left Eye Distance:   Bilateral Distance:    Right Eye Near:   Left Eye Near:    Bilateral Near:     Physical Exam Vitals and nursing note reviewed.  Constitutional:      Appearance: Normal appearance. He is normal weight.  HENT:     Head: Normocephalic and atraumatic.     Mouth/Throat:     Mouth: Mucous membranes are moist.     Pharynx: Oropharynx is clear.  Eyes:     Extraocular Movements: Extraocular movements intact.     Conjunctiva/sclera: Conjunctivae normal.     Pupils: Pupils are equal, round, and reactive to light.  Cardiovascular:     Rate and Rhythm: Normal rate and regular rhythm.     Pulses: Normal pulses.     Heart sounds: Normal heart sounds.  Pulmonary:     Effort: Pulmonary effort is normal.     Breath sounds: Normal breath sounds. No wheezing, rhonchi or rales.  Musculoskeletal:        General: Normal range of motion.     Cervical back: Normal range of motion and neck supple.     Comments: Left knee: No soft tissue swelling noted, limited range of motion with flexion extension, patient expresses bilateral instability and pain when weightbearing; exam limited due to pain today  Skin:    General: Skin is warm and dry.  Neurological:     General: No focal deficit present.     Mental Status: He is alert and oriented to  person, place, and time.  Psychiatric:        Mood and Affect: Mood normal.        Behavior: Behavior normal.      UC Treatments / Results  Labs (all labs ordered are listed, but only abnormal results are displayed) Labs Reviewed - No data to display  EKG   Radiology DG Knee  Complete 4 Views Left  Result Date: 10/03/2023 CLINICAL DATA:  Injury Pt c/o left knee pain starting last night. Pt reports he was attempting to get his phone he dropped last night and had sudden pain in his left knee. EXAM: LEFT KNEE - COMPLETE 4+ VIEW COMPARISON:  None Available. FINDINGS: No evidence of fracture, dislocation, or joint effusion. Patellar subchondral cystic changes suggesting of patellofemoral degenerative changes. No evidence of arthropathy or other focal bone abnormality. Soft tissues are unremarkable. IMPRESSION: No acute displaced fracture or dislocation. Electronically Signed   By: Tish Frederickson M.D.   On: 10/03/2023 09:06    Procedures Procedures (including critical care time)  Medications Ordered in UC Medications - No data to display  Initial Impression / Assessment and Plan / UC Course  I have reviewed the triage vital signs and the nursing notes.  Pertinent labs & imaging results that were available during my care of the patient were reviewed by me and considered in my medical decision making (see chart for details).     MDM: 1.  Acute pain of left knee-Rx'd Celebrex 200 mg capsule: Take 1 capsule daily x 15 days neoprene hinged knee brace placed on left knee prior to discharge.  2.  Injury of left knee, initial encounter-left knee x-ray revealed above. Advised patient to take medication as directed with food to completion.  Encouraged to increase daily water intake to 64 ounces per day while taking this medication.  Advised patient to wear neoprene hinged knee brace 24/7 except with bathing for the next 10 days.  Advised patient may RICE affected area of left knee for 30 minutes 3 times daily for the next 3 days.  Advised may use supportive knee brace for pain/stability.  Advised if symptoms worsen and/or unresolved please follow-up with [facility] for further evaluation.  Contact provided with this AVS today.  Patient discharged home, hemodynamically  stable. Final Clinical Impressions(s) / UC Diagnoses   Final diagnoses:  Acute pain of left knee  Injury of left knee, initial encounter     Discharge Instructions      Advised patient to take medication as directed with food to completion.  Encouraged to increase daily water intake to 64 ounces per day while taking this medication.  Advised patient to wear neoprene hinged knee brace 24/7 except with bathing for the next 10 days.  Advised patient may RICE affected area of left knee for 30 minutes 3 times daily for the next 3 days.  Advised may use supportive knee brace for pain/stability.  Advised if symptoms worsen and/or unresolved please follow-up with [facility] for further evaluation.  Contact provided with this AVS today.     ED Prescriptions     Medication Sig Dispense Auth. Provider   celecoxib (CELEBREX) 200 MG capsule Take 1 capsule (200 mg total) by mouth daily for 15 days. 15 capsule Trevor Iha, FNP      PDMP not reviewed this encounter.   Trevor Iha, FNP 10/03/23 1000

## 2023-10-03 NOTE — Discharge Instructions (Addendum)
Advised patient to take medication as directed with food to completion.  Encouraged to increase daily water intake to 64 ounces per day while taking this medication.  Advised patient to wear neoprene hinged knee brace 24/7 except with bathing for the next 10 days.  Advised patient may RICE affected area of left knee for 30 minutes 3 times daily for the next 3 days.  Advised may use supportive knee brace for pain/stability.  Advised if symptoms worsen and/or unresolved please follow-up with [facility] for further evaluation.  Contact provided with this AVS today.

## 2023-10-04 ENCOUNTER — Ambulatory Visit (INDEPENDENT_AMBULATORY_CARE_PROVIDER_SITE_OTHER): Payer: 59 | Admitting: Family Medicine

## 2023-10-04 ENCOUNTER — Encounter: Payer: Self-pay | Admitting: Family Medicine

## 2023-10-04 ENCOUNTER — Other Ambulatory Visit: Payer: Self-pay

## 2023-10-04 VITALS — BP 110/72 | HR 74 | Ht 65.5 in | Wt 139.0 lb

## 2023-10-04 DIAGNOSIS — M25462 Effusion, left knee: Secondary | ICD-10-CM

## 2023-10-04 DIAGNOSIS — M25562 Pain in left knee: Secondary | ICD-10-CM

## 2023-10-04 NOTE — Patient Instructions (Addendum)
Thank you for coming in today.   You received an injection today. Seek immediate medical attention if the joint becomes red, extremely painful, or is oozing fluid.   Please use Voltaren gel (Generic Diclofenac Gel) up to 4x daily for pain as needed.  This is available over-the-counter as both the name brand Voltaren gel and the generic diclofenac gel.   Ok to use ibuprofen up to 800mg  every 8 hours.  OK to take tylenol.   If not improving next step would typically be an MRI. Let me know.

## 2023-10-04 NOTE — Progress Notes (Signed)
I, Stevenson Clinch, CMA acting as a scribe for Clementeen Graham, MD.  Mark Osborn is a 62 y.o. male who presents to [facility] today for L knee pain ongoing since Sunday. He was seen yesterday at the [facility] and was prescribed Celebrex. Pt locates pain to medial aspect.Tweaked the knee while bending down to pick up phone between chairs. Swelling present at time of injury. Felt a strain at time of injury, denied pop. The knee feels unstable at times. Denies prior injury.   L Knee swelling: yes Mechanical symptoms: no Aggravates: ambulation, WB Treatments tried: NSAID, bracing  Dx imaging: 10/03/23 L knee XR  Pertinent review of systems: No fevers or chills  Relevant historical information: Insomnia.  Hypercholesterolemia.   Exam:  BP 110/72   Pulse 74   Ht 5' 5.5" (1.664 m)   Wt 139 lb (63 kg)   SpO2 100%   BMI 22.78 kg/m  General: Well Developed, well nourished, and in no acute distress.   MSK: Left knee moderate effusion normal-appearing otherwise.  Decreased range of motion lacks full extension. Tender palpation medial joint line. No frank laxity although exam limited by guarding. Positive McMurray's test exam partially limited by guarding. Strength is intact. Antalgic gait.    Lab and Radiology Results  Procedure: Real-time Ultrasound Guided aspiration and injection of left knee joint superior lateral patella space Device: Philips Affiniti 50G/GE Logiq Images permanently stored and available for review in PACS Verbal informed consent obtained.  Discussed risks and benefits of procedure. Warned about infection, bleeding, hyperglycemia damage to structures among others. Patient expresses understanding and agreement Time-out conducted.   Noted no overlying erythema, induration, or other signs of local infection.   Skin prepped in a sterile fashion.   Local anesthesia: Topical Ethyl chloride.   With sterile technique and under real  time ultrasound guidance: 2 mL of lidocaine injected subcutaneous tissue achieving good anesthesia prior to aspiration. Skin sterilized with isopropyl alcohol and an 18-gauge needle was used to access the joint effusion. 30 mL of blood-tinged clear yellow fluid aspirated and syringe exchanged.  40 mg of Kenalog and 2 mL of Marcaine injected into knee joint. Fluid seen entering the joint capsule.   Completed without difficulty   Pain immediately resolved suggesting accurate placement of the medication.   Advised to call if fevers/chills, erythema, induration, drainage, or persistent bleeding.   Images permanently stored and available for review in the ultrasound unit.  Impression: Technically successful ultrasound guided injection.    No results found for this or any previous visit (from the past 72 hour(s)). DG Knee Complete 4 Views Left  Result Date: 10/03/2023 CLINICAL DATA:  Injury Pt c/o left knee pain starting last night. Pt reports he was attempting to get his phone he dropped last night and had sudden pain in his left knee. EXAM: LEFT KNEE - COMPLETE 4+ VIEW COMPARISON:  None Available. FINDINGS: No evidence of fracture, dislocation, or joint effusion. Patellar subchondral cystic changes suggesting of patellofemoral degenerative changes. No evidence of arthropathy or other focal bone abnormality. Soft tissues are unremarkable. IMPRESSION: No acute displaced fracture or dislocation. Electronically Signed   By: Tish Frederickson M.D.   On: 10/03/2023 09:06   I, Clementeen Graham, personally (independently) visualized and performed the interpretation of the images attached in this note.     Assessment and Plan: 62 y.o. male with acute left knee pain after a twisting bending motion.  Differential includes a degenerative medial  meniscus tear.  He is having so much pain and guarding today it is hard to tell if he is having much mechanical symptoms.  Plan for aspiration and injection.  If not improving  with compression sleeve NSAIDs as prescribed including Celebrex and Voltaren gel and the aspiration and injection as above next step should be MRI of the knee.   PDMP not reviewed this encounter. Orders Placed This Encounter  Procedures   [place] LIMITED JOINT SPACE STRUCTURES LOW LEFT(NO LINKED CHARGES)    Order Specific Question:   Reason for Exam (SYMPTOM  OR DIAGNOSIS REQUIRED)    Answer:   left knee pain    Order Specific Question:   Preferred imaging location?    Answer:   [facility]   No orders of the defined types were placed in this encounter.    Discussed warning signs or symptoms. Please see discharge instructions. Patient expresses understanding.   The above documentation has been reviewed and is accurate and complete Clementeen Graham, M.D.

## 2023-10-07 DIAGNOSIS — F4323 Adjustment disorder with mixed anxiety and depressed mood: Secondary | ICD-10-CM | POA: Diagnosis not present

## 2023-11-11 ENCOUNTER — Ambulatory Visit (INDEPENDENT_AMBULATORY_CARE_PROVIDER_SITE_OTHER): Payer: 59

## 2023-11-11 DIAGNOSIS — Z23 Encounter for immunization: Secondary | ICD-10-CM

## 2023-11-11 NOTE — Progress Notes (Signed)
Patient presents in office today for second and final shingles injection. Tolerated injection well.

## 2023-11-22 DIAGNOSIS — F4323 Adjustment disorder with mixed anxiety and depressed mood: Secondary | ICD-10-CM | POA: Diagnosis not present

## 2023-11-26 ENCOUNTER — Encounter: Payer: Self-pay | Admitting: Internal Medicine

## 2023-12-13 DIAGNOSIS — F4323 Adjustment disorder with mixed anxiety and depressed mood: Secondary | ICD-10-CM | POA: Diagnosis not present

## 2023-12-24 ENCOUNTER — Encounter: Payer: Self-pay | Admitting: Internal Medicine

## 2024-01-10 DIAGNOSIS — F4323 Adjustment disorder with mixed anxiety and depressed mood: Secondary | ICD-10-CM | POA: Diagnosis not present

## 2024-01-11 DIAGNOSIS — G4700 Insomnia, unspecified: Secondary | ICD-10-CM | POA: Diagnosis not present

## 2024-01-11 DIAGNOSIS — F419 Anxiety disorder, unspecified: Secondary | ICD-10-CM | POA: Diagnosis not present

## 2024-02-06 ENCOUNTER — Other Ambulatory Visit (HOSPITAL_COMMUNITY): Payer: Self-pay

## 2024-02-07 DIAGNOSIS — L2089 Other atopic dermatitis: Secondary | ICD-10-CM | POA: Diagnosis not present

## 2024-02-07 DIAGNOSIS — F4323 Adjustment disorder with mixed anxiety and depressed mood: Secondary | ICD-10-CM | POA: Diagnosis not present

## 2024-02-14 DIAGNOSIS — G4700 Insomnia, unspecified: Secondary | ICD-10-CM | POA: Diagnosis not present

## 2024-02-14 DIAGNOSIS — F419 Anxiety disorder, unspecified: Secondary | ICD-10-CM | POA: Diagnosis not present

## 2024-03-05 DIAGNOSIS — F4323 Adjustment disorder with mixed anxiety and depressed mood: Secondary | ICD-10-CM | POA: Diagnosis not present

## 2024-03-16 ENCOUNTER — Encounter: Payer: Self-pay | Admitting: Internal Medicine

## 2024-03-27 DIAGNOSIS — F4323 Adjustment disorder with mixed anxiety and depressed mood: Secondary | ICD-10-CM | POA: Diagnosis not present

## 2024-04-02 ENCOUNTER — Ambulatory Visit: Admitting: Internal Medicine

## 2024-04-02 ENCOUNTER — Encounter: Payer: Self-pay | Admitting: Internal Medicine

## 2024-04-02 VITALS — BP 110/68 | HR 74 | Temp 98.3°F | Resp 16 | Ht 65.5 in | Wt 140.0 lb

## 2024-04-02 DIAGNOSIS — Z23 Encounter for immunization: Secondary | ICD-10-CM

## 2024-04-02 DIAGNOSIS — R351 Nocturia: Secondary | ICD-10-CM | POA: Insufficient documentation

## 2024-04-02 DIAGNOSIS — R739 Hyperglycemia, unspecified: Secondary | ICD-10-CM | POA: Insufficient documentation

## 2024-04-02 LAB — BASIC METABOLIC PANEL WITH GFR
BUN: 19 mg/dL (ref 6–23)
CO2: 30 meq/L (ref 19–32)
Calcium: 8.8 mg/dL (ref 8.4–10.5)
Chloride: 102 meq/L (ref 96–112)
Creatinine, Ser: 1.21 mg/dL (ref 0.40–1.50)
GFR: 64.93 mL/min (ref >60.00)
Glucose, Bld: 110 mg/dL — ABNORMAL HIGH (ref 70–99)
Potassium: 4 meq/L (ref 3.5–5.1)
Sodium: 138 meq/L (ref 135–145)

## 2024-04-02 NOTE — Progress Notes (Signed)
 Subjective:  Patient ID: Mark Osborn, male    DOB: 1961-08-25  Age: 63 y.o. MRN: [id number]  CC: Acute Visit   HPI Mark Osborn presents for f/up -----  Discussed the use of AI scribe software for clinical note transcription with the patient, who gave verbal consent to proceed.  History of Present Illness   Mark Osborn is a 63 year old male who presents with intermittent itching in the genital area.  He has experienced intermittent itching in the penis and scrotum area since March. The sensation is described as 'crazy itching' without any associated pain or visible rash. The itching is internal and not relieved by any topical applications, as he has not applied anything to the area.  The symptoms initially improved but recurred last month. He recently restarted Lexapro  and has noticed different sexual side effects compared to his previous use.  No pain during urination, blood or cloudiness in the urine, abdominal pain, nausea, vomiting, fever, chills, weight loss, or testicular pain or swelling. He has noticed a slower urine flow, which he attributes to aging, and mentions that his last PSA was 0.7.  He is not aware of any family history of prostate cancer and does not restrict his fluid intake.       Outpatient Medications Prior to Visit  Medication Sig Dispense Refill  . cetirizine (ZYRTEC) 10 MG tablet Take 10 mg by mouth daily.    . escitalopram  (LEXAPRO ) 10 MG tablet Take 1 tablet (10 mg total) by mouth daily. 90 tablet 3  . triamcinolone  cream (KENALOG) 0.1 % Apply topically 2 (two) times daily as needed.    . tadalafil  (CIALIS ) 10 MG tablet Take 1 tablet (10 mg total) by mouth every other day as needed for erectile dysfunction. 10 tablet 1  . zolpidem  (AMBIEN  CR) 6.25 MG CR tablet Take 1 tablet (6.25 mg total) by mouth at bedtime as needed for sleep. 90 tablet 0   No facility-administered medications prior to visit.    ROS Review of Systems  Constitutional:  Negative  for fatigue.  HENT: Negative.    Eyes: Negative.  Negative for visual disturbance.  Respiratory:  Negative for cough and shortness of breath.   Cardiovascular:  Negative for chest pain, palpitations and leg swelling.  Gastrointestinal: Negative.  Negative for abdominal pain, blood in stool, constipation, diarrhea, nausea and vomiting.  Genitourinary:  Negative for difficulty urinating, dysuria, genital sores, hematuria, penile discharge, penile pain, penile swelling, scrotal swelling, testicular pain and urgency.  Musculoskeletal: Negative.   Skin: Negative.   Neurological: Negative.  Negative for dizziness and weakness.  Hematological:  Negative for adenopathy. Does not bruise/bleed easily.  Psychiatric/Behavioral: Negative.      Objective:  BP 110/68 (BP Location: Right Arm, Patient Position: Sitting, Cuff Size: Normal)   Pulse 74   Temp 98.3 F (36.8 C) (Oral)   Resp 16   Ht 5' 5.5 (1.664 m)   Wt 140 lb (63.5 kg)   SpO2 98%   BMI 22.94 kg/m   BP Readings from Last 3 Encounters:  04/02/24 110/68  10/04/23 110/72  10/03/23 119/82    Wt Readings from Last 3 Encounters:  04/02/24 140 lb (63.5 kg)  10/04/23 139 lb (63 kg)  07/25/23 138 lb 3.2 oz (62.7 kg)    Physical Exam Vitals reviewed.  Constitutional:      Appearance: Normal appearance.  HENT:     Mouth/Throat:     Mouth: Mucous membranes are moist.  Eyes:     General: No scleral icterus.    Conjunctiva/sclera: Conjunctivae normal.    Cardiovascular:     Rate and Rhythm: Normal rate and regular rhythm.     Heart sounds: No murmur heard.    No friction rub. No gallop.  Pulmonary:     Effort: Pulmonary effort is normal.     Breath sounds: No stridor. No wheezing, rhonchi or rales.  Abdominal:     General: Abdomen is flat.     Palpations: There is no mass.     Tenderness: There is no abdominal tenderness. There is no guarding.     Hernia: No hernia is present. There is no hernia in the left inguinal area  or right inguinal area.  Genitourinary:    Pubic Area: No rash.      Penis: Normal and circumcised.      Testes: Normal.        Right: Mass, tenderness, swelling, testicular hydrocele or varicocele not present. Right testis is descended.        Left: Mass, tenderness, swelling, testicular hydrocele or varicocele not present. Left testis is descended.     Epididymis:     Right: Normal. Not inflamed or enlarged. No mass or tenderness.     Left: Normal. Not inflamed or enlarged. No mass or tenderness.     Prostate: Normal. Not enlarged, not tender and no nodules present.     Rectum: Normal. Guaiac result negative. No mass, tenderness, anal fissure, external hemorrhoid or internal hemorrhoid. Normal anal tone.   Musculoskeletal:        General: Normal range of motion.     Cervical back: Neck supple.     Right lower leg: No edema.     Left lower leg: No edema.  Lymphadenopathy:     Cervical: No cervical adenopathy.     Lower Body: No right inguinal adenopathy. No left inguinal adenopathy.   Skin:    General: Skin is warm and dry.     Findings: No rash.   Neurological:     General: No focal deficit present.     Mental Status: He is alert. Mental status is at baseline.   Psychiatric:        Mood and Affect: Mood normal.        Behavior: Behavior normal.    Lab Results  Component Value Date   WBC 5.7 05/24/2023   HGB 13.7 05/24/2023   HCT 41.4 05/24/2023   PLT 213.0 05/24/2023   GLUCOSE 110 (H) 04/02/2024   CHOL 228 (H) 07/25/2023   TRIG 136.0 07/25/2023   HDL 47.60 07/25/2023   LDLCALC 153 (H) 07/25/2023   ALT 39 07/25/2023   AST 29 07/25/2023   NA 138 04/02/2024   K 4.0 04/02/2024   CL 102 04/02/2024   CREATININE 1.21 04/02/2024   BUN 19 04/02/2024   CO2 30 04/02/2024   TSH 1.10 04/02/2024   PSA 0.51 04/02/2024   INR 1.2 (H) 07/25/2023   HGBA1C 5.8 04/02/2024    US  LIMITED JOINT SPACE STRUCTURES LOW LEFT(NO LINKED CHARGES) Result Date: 10/31/2023 Procedure:  Real-time Ultrasound Guided aspiration and injection of left knee joint superior lateral patella space Device: Philips Affiniti 50G/GE Logiq Images permanently stored and available for review in PACS Verbal informed consent obtained.  Discussed risks and benefits of procedure. Warned about infection, bleeding, hyperglycemia damage to structures among others. Patient expresses understanding and agreement Time-out conducted.  Noted no overlying erythema, induration, or other signs of  local infection.  Skin prepped in a sterile fashion.  Local anesthesia: Topical Ethyl chloride.  With sterile technique and under real time ultrasound guidance: 2 mL of lidocaine injected subcutaneous tissue achieving good anesthesia prior to aspiration. Skin sterilized with isopropyl alcohol and an 18-gauge needle was used to access the joint effusion. 30 mL of blood-tinged clear yellow fluid aspirated and syringe exchanged.  40 mg of Kenalog and 2 mL of Marcaine injected into knee joint. Fluid seen entering the joint capsule.  Completed without difficulty  Pain immediately resolved suggesting accurate placement of the medication.  Advised to call if fevers/chills, erythema, induration, drainage, or persistent bleeding.  Images permanently stored and available for review in the ultrasound unit. Impression: Technically successful ultrasound guided injection.    DG Knee Complete 4 Views Left Result Date: 10/03/2023 CLINICAL DATA:  Injury Pt c/o left knee pain starting last night. Pt reports he was attempting to get his phone he dropped last night and had sudden pain in his left knee. EXAM: LEFT KNEE - COMPLETE 4+ VIEW COMPARISON:  None Available. FINDINGS: No evidence of fracture, dislocation, or joint effusion. Patellar subchondral cystic changes suggesting of patellofemoral degenerative changes. No evidence of arthropathy or other focal bone abnormality. Soft tissues are unremarkable. IMPRESSION: No acute displaced fracture or  dislocation. Electronically Signed   By: Morgane  Naveau M.D.   On: 10/03/2023 09:06    Assessment & Plan:  Nocturia- Exam and labs are reassuring. -     PSA; Future -     Urinalysis, Routine w reflex microscopic; Future -     Basic metabolic panel with GFR; Future -     TSH; Future  Chronic hyperglycemia -     Hemoglobin A1c; Future -     Urinalysis, Routine w reflex microscopic; Future -     Basic metabolic panel with GFR; Future  Need for Tdap vaccination -     Tdap vaccine greater than or equal to 7yo IM     Follow-up: Return in about 6 months (around 10/02/2024).  Sandra Crouch, MD

## 2024-04-03 LAB — URINALYSIS, ROUTINE W REFLEX MICROSCOPIC
Bilirubin Urine: NEGATIVE
Ketones, ur: NEGATIVE
Leukocytes,Ua: NEGATIVE
Nitrite: NEGATIVE
Specific Gravity, Urine: 1.02 (ref 1.000–1.030)
Total Protein, Urine: NEGATIVE
Urine Glucose: NEGATIVE
Urobilinogen, UA: 0.2 (ref 0.0–1.0)
WBC, UA: NONE SEEN (ref 0–?)
pH: 6 (ref 5.0–8.0)

## 2024-04-03 LAB — HEMOGLOBIN A1C: Hgb A1c MFr Bld: 5.8 % (ref 4.6–6.5)

## 2024-04-05 ENCOUNTER — Ambulatory Visit: Payer: Self-pay | Admitting: Internal Medicine

## 2024-04-05 DIAGNOSIS — Z872 Personal history of diseases of the skin and subcutaneous tissue: Secondary | ICD-10-CM | POA: Diagnosis not present

## 2024-04-05 DIAGNOSIS — D485 Neoplasm of uncertain behavior of skin: Secondary | ICD-10-CM | POA: Diagnosis not present

## 2024-04-05 DIAGNOSIS — D224 Melanocytic nevi of scalp and neck: Secondary | ICD-10-CM | POA: Diagnosis not present

## 2024-04-05 LAB — TSH: TSH: 1.1 u[IU]/mL (ref 0.35–5.50)

## 2024-04-05 LAB — PSA: PSA: 0.51 ng/mL (ref 0.10–4.00)

## 2024-04-16 DIAGNOSIS — F4323 Adjustment disorder with mixed anxiety and depressed mood: Secondary | ICD-10-CM | POA: Diagnosis not present

## 2024-05-10 DIAGNOSIS — F4323 Adjustment disorder with mixed anxiety and depressed mood: Secondary | ICD-10-CM | POA: Diagnosis not present

## 2024-05-17 DIAGNOSIS — F4323 Adjustment disorder with mixed anxiety and depressed mood: Secondary | ICD-10-CM | POA: Diagnosis not present

## 2024-06-12 DIAGNOSIS — F4323 Adjustment disorder with mixed anxiety and depressed mood: Secondary | ICD-10-CM | POA: Diagnosis not present

## 2024-07-06 DIAGNOSIS — F4323 Adjustment disorder with mixed anxiety and depressed mood: Secondary | ICD-10-CM | POA: Diagnosis not present

## 2024-07-10 DIAGNOSIS — L814 Other melanin hyperpigmentation: Secondary | ICD-10-CM | POA: Diagnosis not present

## 2024-07-10 DIAGNOSIS — L578 Other skin changes due to chronic exposure to nonionizing radiation: Secondary | ICD-10-CM | POA: Diagnosis not present

## 2024-07-10 DIAGNOSIS — L821 Other seborrheic keratosis: Secondary | ICD-10-CM | POA: Diagnosis not present

## 2024-07-26 DIAGNOSIS — F4323 Adjustment disorder with mixed anxiety and depressed mood: Secondary | ICD-10-CM | POA: Diagnosis not present

## 2024-08-02 ENCOUNTER — Ambulatory Visit: Admitting: Internal Medicine

## 2024-08-02 VITALS — BP 110/68 | HR 60 | Temp 97.7°F | Resp 16 | Ht 65.5 in | Wt 136.0 lb

## 2024-08-02 DIAGNOSIS — I950 Idiopathic hypotension: Secondary | ICD-10-CM

## 2024-08-02 DIAGNOSIS — Z0000 Encounter for general adult medical examination without abnormal findings: Secondary | ICD-10-CM

## 2024-08-02 DIAGNOSIS — E7800 Pure hypercholesterolemia, unspecified: Secondary | ICD-10-CM | POA: Diagnosis not present

## 2024-08-02 DIAGNOSIS — R7989 Other specified abnormal findings of blood chemistry: Secondary | ICD-10-CM

## 2024-08-02 DIAGNOSIS — Z23 Encounter for immunization: Secondary | ICD-10-CM | POA: Diagnosis not present

## 2024-08-02 DIAGNOSIS — I10 Essential (primary) hypertension: Secondary | ICD-10-CM | POA: Diagnosis not present

## 2024-08-02 LAB — CBC WITH DIFFERENTIAL/PLATELET
Basophils Absolute: 0 K/uL (ref 0.0–0.1)
Basophils Relative: 0.5 % (ref 0.0–3.0)
Eosinophils Absolute: 0 K/uL (ref 0.0–0.7)
Eosinophils Relative: 0.9 % (ref 0.0–5.0)
HCT: 42.4 % (ref 39.0–52.0)
Hemoglobin: 14.1 g/dL (ref 13.0–17.0)
Lymphocytes Relative: 21 % (ref 12.0–46.0)
Lymphs Abs: 1.2 K/uL (ref 0.7–4.0)
MCHC: 33.2 g/dL (ref 30.0–36.0)
MCV: 91.5 fl (ref 78.0–100.0)
Monocytes Absolute: 0.4 K/uL (ref 0.1–1.0)
Monocytes Relative: 6.9 % (ref 3.0–12.0)
Neutro Abs: 3.9 K/uL (ref 1.4–7.7)
Neutrophils Relative %: 70.7 % (ref 43.0–77.0)
Platelets: 198 K/uL (ref 150.0–400.0)
RBC: 4.63 Mil/uL (ref 4.22–5.81)
RDW: 13.5 % (ref 11.5–15.5)
WBC: 5.6 K/uL (ref 4.0–10.5)

## 2024-08-02 LAB — BASIC METABOLIC PANEL WITH GFR
BUN: 16 mg/dL (ref 6–23)
CO2: 31 meq/L (ref 19–32)
Calcium: 9.1 mg/dL (ref 8.4–10.5)
Chloride: 103 meq/L (ref 96–112)
Creatinine, Ser: 1.15 mg/dL (ref 0.40–1.50)
GFR: 68.85 mL/min (ref >60.00)
Glucose, Bld: 91 mg/dL (ref 70–99)
Potassium: 4 meq/L (ref 3.5–5.1)
Sodium: 141 meq/L (ref 135–145)

## 2024-08-02 LAB — LIPID PANEL
Cholesterol: 218 mg/dL — ABNORMAL HIGH (ref 0–200)
HDL: 50.2 mg/dL (ref >39.00)
LDL Cholesterol: 147 mg/dL — ABNORMAL HIGH (ref 0–99)
NonHDL: 168.23
Total CHOL/HDL Ratio: 4
Triglycerides: 107 mg/dL (ref 0.0–149.0)
VLDL: 21.4 mg/dL (ref 0.0–40.0)

## 2024-08-02 LAB — HEPATIC FUNCTION PANEL
ALT: 20 U/L (ref 0–53)
AST: 24 U/L (ref 0–37)
Albumin: 4.4 g/dL (ref 3.5–5.2)
Alkaline Phosphatase: 72 U/L (ref 39–117)
Bilirubin, Direct: 0.1 mg/dL (ref 0.0–0.3)
Total Bilirubin: 0.7 mg/dL (ref 0.2–1.2)
Total Protein: 6.5 g/dL (ref 6.0–8.3)

## 2024-08-02 LAB — CORTISOL: Cortisol, Plasma: 9.8 ug/dL

## 2024-08-02 LAB — PROTIME-INR
INR: 1.1 ratio — ABNORMAL HIGH (ref 0.8–1.0)
Prothrombin Time: 11.8 s (ref 9.6–13.1)

## 2024-08-02 NOTE — Progress Notes (Signed)
 Subjective:  Patient ID: Mark Osborn, male    DOB: [birth year]  Age: 63 y.o. MRN: [id number]  CC: Annual Exam   HPI Mark Osborn presents for a CPX and f/up --   Discussed the use of AI scribe software for clinical note transcription with the patient, who gave verbal consent to proceed.  History of Present Illness Mark Osborn is a 63 year old male who presents for a routine follow-up visit.  He generally feels well with no significant issues. He has consistently low blood pressure without dizziness or lightheadedness. His heart rate is around 60 and asymptomatic, with no dizziness or lightheadedness.  He engages in regular physical activity, including cardio and weight training at the gym, and experiences no chest pain or shortness of breath during exertion.  Previously experienced nocturia, which has improved with normal urination frequency and flow. No excessive thirst or urination. Recently received a prescription for Flomax.  No issues with bowel movements and his weight has remained steady over time.   Outpatient Medications Prior to Visit  Medication Sig Dispense Refill   cetirizine (ZYRTEC) 10 MG tablet Take 10 mg by mouth daily.     escitalopram  (LEXAPRO ) 10 MG tablet Take 1 tablet (10 mg total) by mouth daily. 90 tablet 3   triamcinolone  cream (KENALOG) 0.1 % Apply topically 2 (two) times daily as needed.     No facility-administered medications prior to visit.    ROS Review of Systems  Constitutional:  Negative for appetite change, chills, diaphoresis, fatigue and fever.  HENT: Negative.    Eyes: Negative.   Respiratory: Negative.  Negative for cough, chest tightness, shortness of breath and wheezing.   Cardiovascular:  Negative for chest pain, palpitations and leg swelling.  Gastrointestinal: Negative.  Negative for abdominal pain, constipation, diarrhea, nausea and vomiting.  Endocrine: Negative.   Genitourinary: Negative.  Negative for decreased urine  volume and difficulty urinating.  Musculoskeletal: Negative.  Negative for arthralgias and myalgias.  Skin: Negative.   Neurological: Negative.  Negative for dizziness and weakness.  Hematological:  Negative for adenopathy. Does not bruise/bleed easily.  Psychiatric/Behavioral: Negative.      Objective:  BP 110/68 (BP Location: Left Arm, Patient Position: Sitting, Cuff Size: Normal)   Pulse 60   Temp 97.7 F (36.5 C) (Oral)   Resp 16   Ht 5' 5.5 (1.664 m)   Wt 136 lb (61.7 kg)   SpO2 98%   BMI 22.29 kg/m   BP Readings from Last 3 Encounters:  08/02/24 110/68  04/02/24 110/68  10/04/23 110/72    Wt Readings from Last 3 Encounters:  08/02/24 136 lb (61.7 kg)  04/02/24 140 lb (63.5 kg)  10/04/23 139 lb (63 kg)    Physical Exam Vitals reviewed.  Constitutional:      Appearance: Normal appearance.  HENT:     Mouth/Throat:     Mouth: Mucous membranes are moist.  Eyes:     General: No scleral icterus.    Conjunctiva/sclera: Conjunctivae normal.  Cardiovascular:     Rate and Rhythm: Normal rate and regular rhythm.     Heart sounds: No murmur heard.    No friction rub. No gallop.     Comments: EKG--- NSR, 61 bpm Possible LAE No LVH, Q waves, or ST/T wave changes  No old EKG to compare Pulmonary:     Effort: Pulmonary effort is normal.     Breath sounds: No stridor. No wheezing, rhonchi or rales.  Abdominal:  General: Abdomen is flat.     Palpations: There is no mass.     Tenderness: There is no abdominal tenderness. There is no guarding.     Hernia: No hernia is present.  Musculoskeletal:        General: Normal range of motion.     Cervical back: Neck supple.     Right lower leg: No edema.     Left lower leg: No edema.  Skin:    General: Skin is warm and dry.  Neurological:     General: No focal deficit present.     Mental Status: He is alert.  Psychiatric:        Mood and Affect: Mood normal.        Behavior: Behavior normal.     Lab Results   Component Value Date   WBC 5.6 08/02/2024   HGB 14.1 08/02/2024   HCT 42.4 08/02/2024   PLT 198.0 08/02/2024   GLUCOSE 91 08/02/2024   CHOL 218 (H) 08/02/2024   TRIG 107.0 08/02/2024   HDL 50.20 08/02/2024   LDLCALC 147 (H) 08/02/2024   ALT 20 08/02/2024   AST 24 08/02/2024   NA 141 08/02/2024   K 4.0 08/02/2024   CL 103 08/02/2024   CREATININE 1.15 08/02/2024   BUN 16 08/02/2024   CO2 31 08/02/2024   TSH 1.10 04/02/2024   PSA 0.51 04/02/2024   INR 1.1 (H) 08/02/2024   HGBA1C 5.8 04/02/2024    US  LIMITED JOINT SPACE STRUCTURES LOW LEFT(NO LINKED CHARGES) Result Date: 10/31/2023 Procedure: Real-time Ultrasound Guided aspiration and injection of left knee joint superior lateral patella space Device: [employer] Affiniti 50G/GE Logiq Images permanently stored and available for review in PACS Verbal informed consent obtained.  Discussed risks and benefits of procedure. Warned about infection, bleeding, hyperglycemia damage to structures among others. Patient expresses understanding and agreement Time-out conducted.  Noted no overlying erythema, induration, or other signs of local infection.  Skin prepped in a sterile fashion.  Local anesthesia: Topical Ethyl chloride.  With sterile technique and under real time ultrasound guidance: 2 mL of lidocaine injected subcutaneous tissue achieving good anesthesia prior to aspiration. Skin sterilized with isopropyl alcohol and an 18-gauge needle was used to access the joint effusion. 30 mL of blood-tinged clear yellow fluid aspirated and syringe exchanged.  40 mg of Kenalog and 2 mL of Marcaine injected into knee joint. Fluid seen entering the joint capsule.  Completed without difficulty  Pain immediately resolved suggesting accurate placement of the medication.  Advised to call if fevers/chills, erythema, induration, drainage, or persistent bleeding.  Images permanently stored and available for review in the ultrasound unit. Impression: Technically  successful ultrasound guided injection.    DG Knee Complete 4 Views Left Result Date: 10/03/2023 CLINICAL DATA:  Injury Pt c/o left knee pain starting last night. Pt reports he was attempting to get his phone he dropped last night and had sudden pain in his left knee. EXAM: LEFT KNEE - COMPLETE 4+ VIEW COMPARISON:  None Available. FINDINGS: No evidence of fracture, dislocation, or joint effusion. Patellar subchondral cystic changes suggesting of patellofemoral degenerative changes. No evidence of arthropathy or other focal bone abnormality. Soft tissues are unremarkable. IMPRESSION: No acute displaced fracture or dislocation. Electronically Signed   By: Morgane  Naveau M.D.   On: 10/03/2023 09:06    Fibrosis 4 Score = 1.65 (Indeterminate)       Interpretation for patients with NAFLD          <1.30       -  F0-F1 (Low risk)          1.30-2.67 -  Indeterminate           >2.67      -  F3-F4 (High risk)     Validated for ages 57-65         Assessment & Plan:  Need for immunization against influenza -     Flu vaccine trivalent PF, 6mos and older(Flulaval,Afluria,Fluarix,Fluzone)  Routine general medical examination at a health care facility- Exam completed, labs reviewed, vaccines reviewed and updated, cancer screenings are UTD, pt ed material was given.  -     Lipid panel; Future  Elevated LFTs -     Hepatic function panel; Future -     Hepatitis B surface antibody,quantitative; Future -     Hepatitis B surface antigen; Future -     Protime-INR; Future  Idiopathic hypotension- Labs and EKG are normal. -     Basic metabolic panel with GFR; Future -     CBC with Differential/Platelet; Future -     Hepatic function panel; Future -     Cortisol; Future   Hypercholesteremia- Statin is not indicated.  Other orders -     EKG     Follow-up: Return in about 6 months (around 01/31/2025).  Debby Molt, MD

## 2024-08-02 NOTE — Patient Instructions (Signed)
 Health Maintenance, Male  Adopting a healthy lifestyle and getting preventive care are important in promoting health and wellness. Ask your health care provider about:  The right schedule for you to have regular tests and exams.  Things you can do on your own to prevent diseases and keep yourself healthy.  What should I know about diet, weight, and exercise?  Eat a healthy diet    Eat a diet that includes plenty of vegetables, fruits, low-fat dairy products, and lean protein.  Do not eat a lot of foods that are high in solid fats, added sugars, or sodium.  Maintain a healthy weight  Body mass index (BMI) is a measurement that can be used to identify possible weight problems. It estimates body fat based on height and weight. Your health care provider can help determine your BMI and help you achieve or maintain a healthy weight.  Get regular exercise  Get regular exercise. This is one of the most important things you can do for your health. Most adults should:  Exercise for at least 150 minutes each week. The exercise should increase your heart rate and make you sweat (moderate-intensity exercise).  Do strengthening exercises at least twice a week. This is in addition to the moderate-intensity exercise.  Spend less time sitting. Even light physical activity can be beneficial.  Watch cholesterol and blood lipids  Have your blood tested for lipids and cholesterol at 61 years of age, then have this test every 5 years.  You may need to have your cholesterol levels checked more often if:  Your lipid or cholesterol levels are high.  You are older than 61 years of age.  You are at high risk for heart disease.  What should I know about cancer screening?  Many types of cancers can be detected early and may often be prevented. Depending on your health history and family history, you may need to have cancer screening at various ages. This may include screening for:  Colorectal cancer.  Prostate cancer.  Skin cancer.  Lung  cancer.  What should I know about heart disease, diabetes, and high blood pressure?  Blood pressure and heart disease  High blood pressure causes heart disease and increases the risk of stroke. This is more likely to develop in people who have high blood pressure readings or are overweight.  Talk with your health care provider about your target blood pressure readings.  Have your blood pressure checked:  Every 3-5 years if you are 24-52 years of age.  Every year if you are 3 years old or older.  If you are between the ages of 60 and 72 and are a current or former smoker, ask your health care provider if you should have a one-time screening for abdominal aortic aneurysm (AAA).  Diabetes  Have regular diabetes screenings. This checks your fasting blood sugar level. Have the screening done:  Once every three years after age 66 if you are at a normal weight and have a low risk for diabetes.  More often and at a younger age if you are overweight or have a high risk for diabetes.  What should I know about preventing infection?  Hepatitis B  If you have a higher risk for hepatitis B, you should be screened for this virus. Talk with your health care provider to find out if you are at risk for hepatitis B infection.  Hepatitis C  Blood testing is recommended for:  Everyone born from [birth year] through 1963.  Anyone  with known risk factors for hepatitis C.  Sexually transmitted infections (STIs)  You should be screened each year for STIs, including gonorrhea and chlamydia, if:  You are sexually active and are younger than 61 years of age.  You are older than 61 years of age and your health care provider tells you that you are at risk for this type of infection.  Your sexual activity has changed since you were last screened, and you are at increased risk for chlamydia or gonorrhea. Ask your health care provider if you are at risk.  Ask your health care provider about whether you are at high risk for HIV. Your health care provider  may recommend a prescription medicine to help prevent HIV infection. If you choose to take medicine to prevent HIV, you should first get tested for HIV. You should then be tested every 3 months for as long as you are taking the medicine.  Follow these instructions at home:  Alcohol use  Do not drink alcohol if your health care provider tells you not to drink.  If you drink alcohol:  Limit how much you have to 0-2 drinks a day.  Know how much alcohol is in your drink. In the [place] one drink equals one 12 oz bottle of beer (355 mL), one 5 oz glass of wine (148 mL), or one 1 oz glass of hard liquor (44 mL).  Lifestyle  Do not use any products that contain nicotine or tobacco. These products include cigarettes, chewing tobacco, and vaping devices, such as e-cigarettes. If you need help quitting, ask your health care provider.  Do not use street drugs.  Do not share needles.  Ask your health care provider for help if you need support or information about quitting drugs.  General instructions  Schedule regular health, dental, and eye exams.  Stay current with your vaccines.  Tell your health care provider if:  You often feel depressed.  You have ever been abused or do not feel safe at home.  Summary  Adopting a healthy lifestyle and getting preventive care are important in promoting health and wellness.  Follow your health care provider's instructions about healthy diet, exercising, and getting tested or screened for diseases.  Follow your health care provider's instructions on monitoring your cholesterol and blood pressure.  This information is not intended to replace advice given to you by your health care provider. Make sure you discuss any questions you have with your health care provider.  Document Revised: 03/02/2021 Document Reviewed: 03/02/2021  Elsevier Patient Education  2024 [employer].

## 2024-08-03 LAB — HEPATITIS B SURFACE ANTIGEN: Hepatitis B Surface Ag: NONREACTIVE

## 2024-08-03 LAB — HEPATITIS B SURFACE ANTIBODY, QUANTITATIVE: Hep B S AB Quant (Post): <5 m[IU]/mL — ABNORMAL LOW (ref >=10)

## 2024-08-04 ENCOUNTER — Ambulatory Visit: Payer: Self-pay | Admitting: Internal Medicine

## 2024-08-13 ENCOUNTER — Other Ambulatory Visit: Payer: Self-pay

## 2024-08-13 ENCOUNTER — Encounter: Payer: Self-pay | Admitting: Pharmacist

## 2024-08-17 DIAGNOSIS — F4323 Adjustment disorder with mixed anxiety and depressed mood: Secondary | ICD-10-CM | POA: Diagnosis not present

## 2024-10-30 ENCOUNTER — Other Ambulatory Visit (HOSPITAL_COMMUNITY): Payer: Self-pay

## 2024-11-12 ENCOUNTER — Other Ambulatory Visit (HOSPITAL_COMMUNITY): Payer: Self-pay

## 2024-11-15 ENCOUNTER — Other Ambulatory Visit (HOSPITAL_COMMUNITY): Payer: Self-pay

## 2024-11-16 ENCOUNTER — Encounter: Payer: Self-pay | Admitting: Internal Medicine

## 2024-11-16 ENCOUNTER — Encounter (HOSPITAL_COMMUNITY): Payer: Self-pay

## 2024-11-19 ENCOUNTER — Other Ambulatory Visit (HOSPITAL_COMMUNITY): Payer: Self-pay

## 2024-11-19 ENCOUNTER — Other Ambulatory Visit: Payer: Self-pay

## 2024-11-19 MED ORDER — ESCITALOPRAM OXALATE 10 MG PO TABS
10.0000 mg | ORAL_TABLET | Freq: Every day | ORAL | 0 refills | Status: AC
  Filled 2024-11-19: qty 90, 90d supply, fill #0

## 2024-11-20 ENCOUNTER — Other Ambulatory Visit: Payer: Self-pay

## 2024-11-20 ENCOUNTER — Other Ambulatory Visit (HOSPITAL_COMMUNITY): Payer: Self-pay

## 2024-11-22 ENCOUNTER — Other Ambulatory Visit (HOSPITAL_COMMUNITY): Payer: Self-pay
